# Patient Record
Sex: Male | Born: 1945 | Race: Black or African American | Hispanic: No | Smoking: Former smoker
Health system: Southern US, Community
[De-identification: ages and names within clinical notes are randomized; demographics above are authoritative.]

## PROBLEM LIST (undated history)

## (undated) DIAGNOSIS — E785 Hyperlipidemia, unspecified: Secondary | ICD-10-CM

## (undated) DIAGNOSIS — M1990 Unspecified osteoarthritis, unspecified site: Secondary | ICD-10-CM

## (undated) DIAGNOSIS — I10 Essential (primary) hypertension: Secondary | ICD-10-CM

## (undated) DIAGNOSIS — R51 Headache: Secondary | ICD-10-CM

## (undated) HISTORY — DX: Essential (primary) hypertension: I10

## (undated) HISTORY — DX: Hyperlipidemia, unspecified: E78.5

---

## 1972-07-18 HISTORY — PX: FRACTURE SURGERY: SHX138

## 1999-07-01 ENCOUNTER — Emergency Department (HOSPITAL_COMMUNITY): Admission: EM | Admit: 1999-07-01 | Discharge: 1999-07-01 | Payer: Self-pay | Admitting: Emergency Medicine

## 1999-07-01 ENCOUNTER — Encounter: Payer: Self-pay | Admitting: Emergency Medicine

## 2003-09-16 ENCOUNTER — Emergency Department (HOSPITAL_COMMUNITY): Admission: EM | Admit: 2003-09-16 | Discharge: 2003-09-16 | Payer: Self-pay

## 2003-11-10 ENCOUNTER — Ambulatory Visit (HOSPITAL_BASED_OUTPATIENT_CLINIC_OR_DEPARTMENT_OTHER): Admission: RE | Admit: 2003-11-10 | Discharge: 2003-11-10 | Payer: Self-pay | Admitting: General Surgery

## 2003-11-10 ENCOUNTER — Ambulatory Visit (HOSPITAL_COMMUNITY): Admission: RE | Admit: 2003-11-10 | Discharge: 2003-11-10 | Payer: Self-pay | Admitting: General Surgery

## 2003-11-10 ENCOUNTER — Encounter (INDEPENDENT_AMBULATORY_CARE_PROVIDER_SITE_OTHER): Payer: Self-pay | Admitting: *Deleted

## 2007-03-16 ENCOUNTER — Emergency Department (HOSPITAL_COMMUNITY): Admission: EM | Admit: 2007-03-16 | Discharge: 2007-03-17 | Payer: Self-pay | Admitting: Emergency Medicine

## 2007-07-19 HISTORY — PX: JOINT REPLACEMENT: SHX530

## 2008-02-26 ENCOUNTER — Ambulatory Visit: Payer: Self-pay

## 2008-04-16 ENCOUNTER — Inpatient Hospital Stay (HOSPITAL_COMMUNITY): Admission: RE | Admit: 2008-04-16 | Discharge: 2008-04-19 | Payer: Self-pay | Admitting: Orthopedic Surgery

## 2008-09-18 ENCOUNTER — Encounter (INDEPENDENT_AMBULATORY_CARE_PROVIDER_SITE_OTHER): Payer: Self-pay | Admitting: *Deleted

## 2008-09-18 ENCOUNTER — Observation Stay (HOSPITAL_COMMUNITY): Admission: EM | Admit: 2008-09-18 | Discharge: 2008-09-18 | Payer: Self-pay | Admitting: *Deleted

## 2008-09-19 ENCOUNTER — Inpatient Hospital Stay (HOSPITAL_COMMUNITY): Admission: EM | Admit: 2008-09-19 | Discharge: 2008-09-21 | Payer: Self-pay | Admitting: Emergency Medicine

## 2009-07-18 HISTORY — PX: JOINT REPLACEMENT: SHX530

## 2009-09-23 IMAGING — CR DG CHEST 2V
2 series · 2 of 2 positions shown · non-contrast
Comparison: None available during PACS downtime.

Addendum Begins

With comparison to 09/16/2003, no change to original report.
Addendum Ends
CLINICAL DATA: Arthritis.  Preoperative for total knee
arthroplasty.
CHEST - 2 VIEW

[view not recorded (1 of 2)]
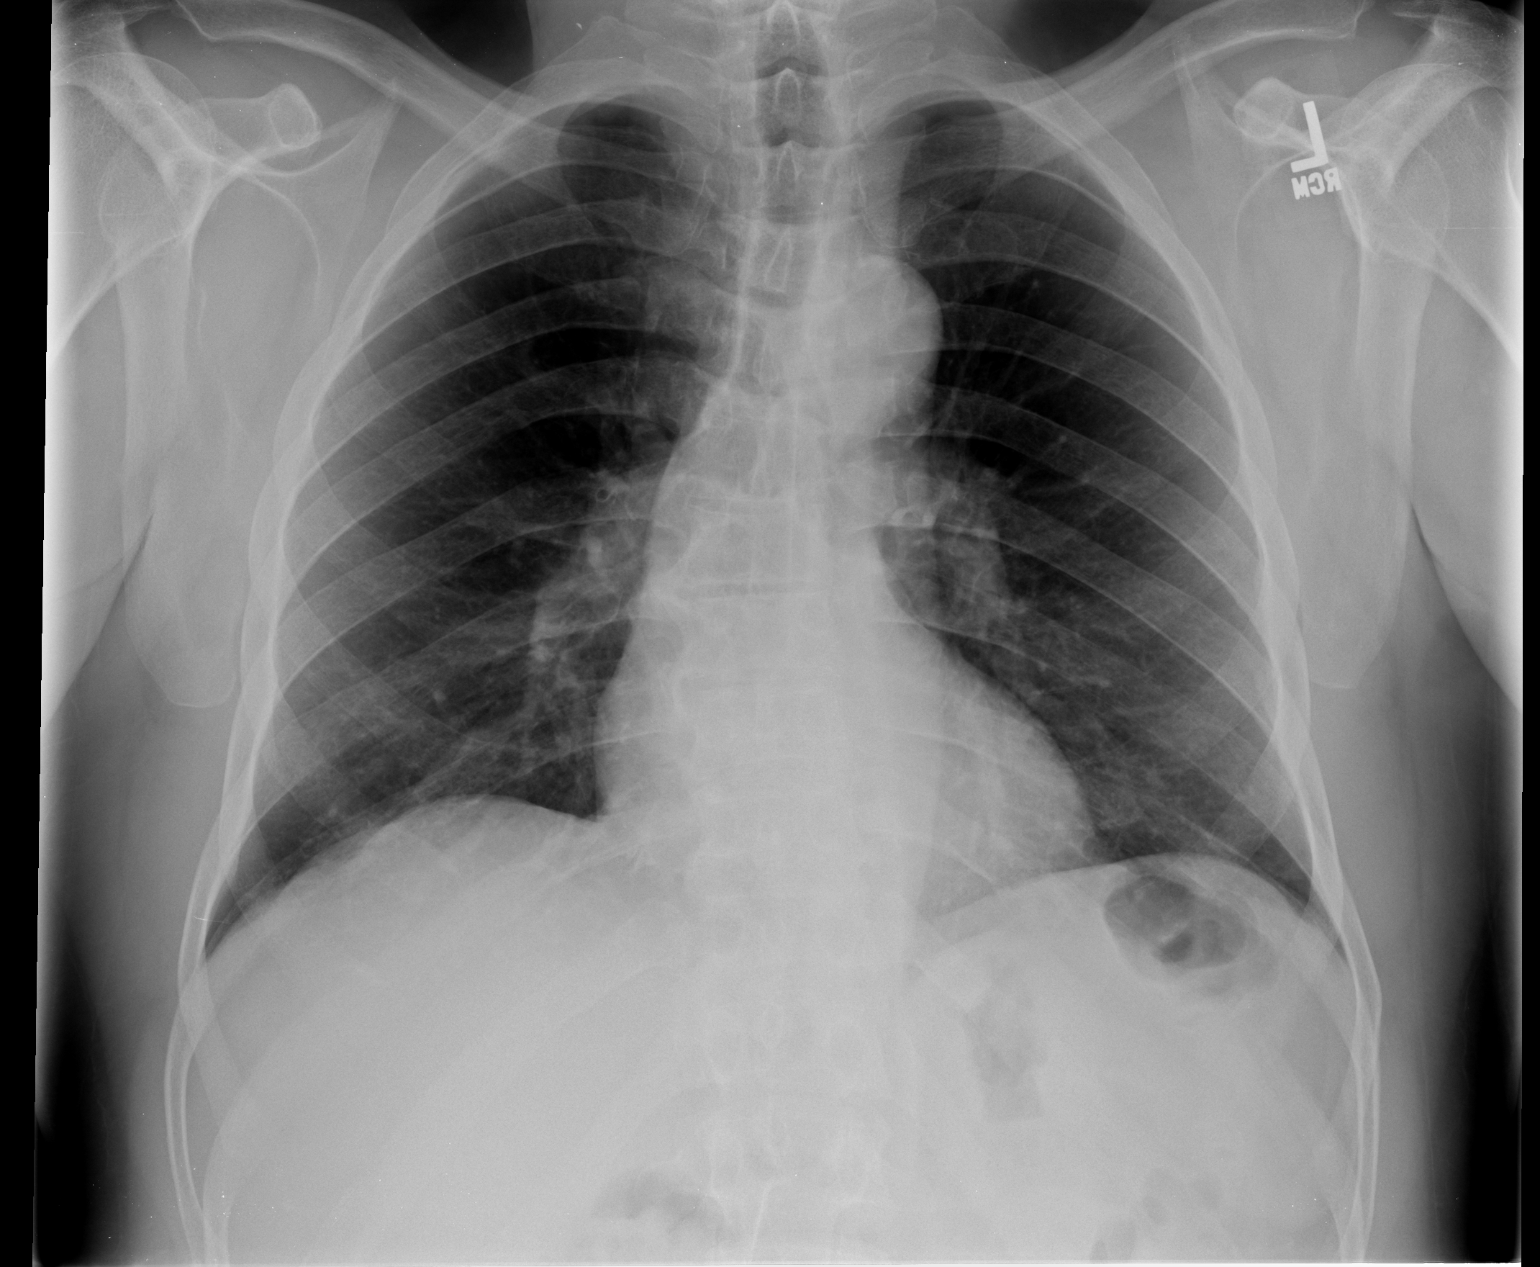

[view not recorded (2 of 2)]
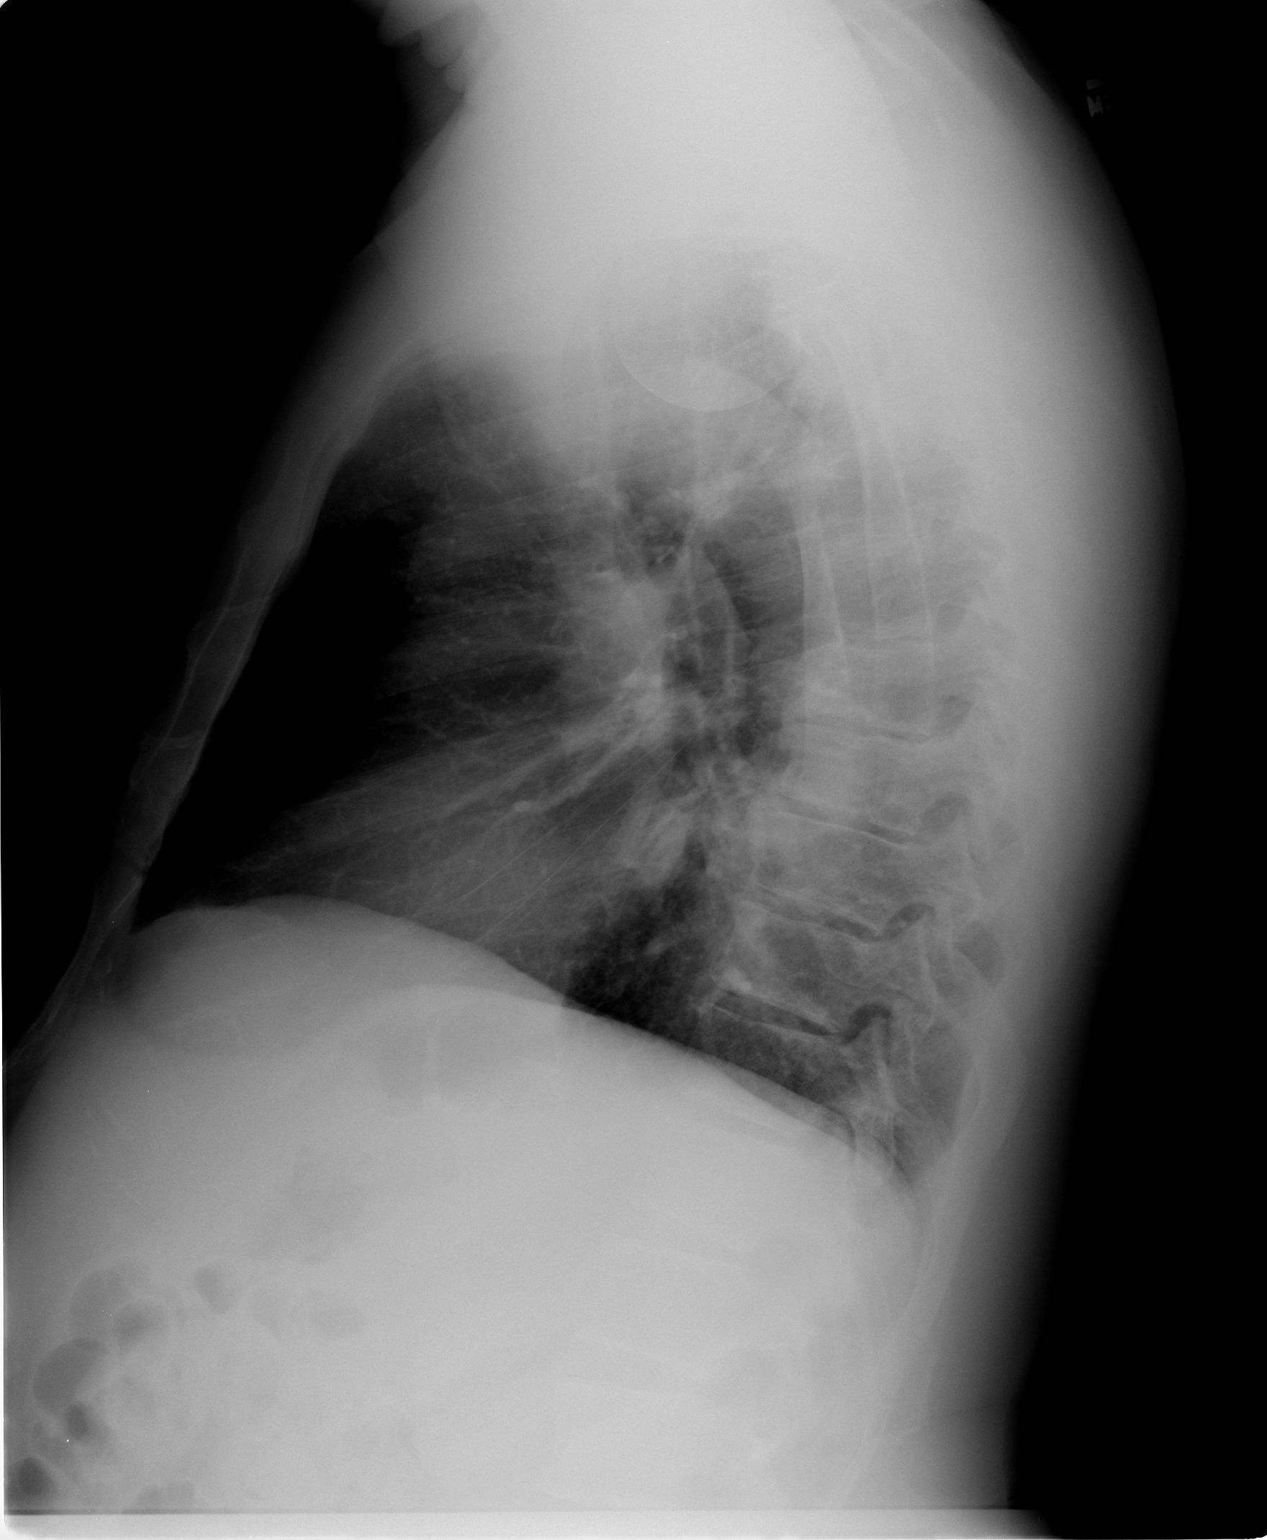

[2 of 2 positions shown; findings below may reference images not displayed]

FINDINGS: Minimal airway thickening suggests bronchitis or reactive
airways disease.  No discrete airspace opacity is identified.  No
acute thoracic findings are noted.

Cardiac and mediastinal structures appear unremarkable.
IMPRESSION: 1.  Mild airway thickening suggests bronchitis or reactive airways
disease.
2. This report was generated during a PACS downtime while prior
exams were not available. Once priors are available, the exam will
be reviewed. If there is significant new information on the basis
of prior exams, an addendum will be issued.

## 2010-07-14 ENCOUNTER — Inpatient Hospital Stay (HOSPITAL_COMMUNITY)
Admission: RE | Admit: 2010-07-14 | Discharge: 2010-07-17 | Payer: Self-pay | Source: Home / Self Care | Attending: Orthopedic Surgery | Admitting: Orthopedic Surgery

## 2010-09-06 NOTE — Discharge Summary (Signed)
[name], [name]               ACCOUNT NO.:  [id number]  MEDICAL RECORD NO.:  [id number]          PATIENT TYPE:  INP  LOCATION:  5005                         FACILITY:  [facility]  PHYSICIAN:  Loreta Ave, M.D. DATE OF BIRTH:  1945-07-24  DATE OF ADMISSION:  07/14/2010 DATE OF DISCHARGE:  07/17/2010                              DISCHARGE SUMMARY   FINAL DIAGNOSES: 1. 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 2. Hypertension.  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  He had progressively worsening pain with failed no response with conservative treatment. Significant decrease in his daily activities due to the ongoing complaint.  HOSPITAL COURSE:  On July 14, 2010, the patient was taken to the [facility] OR and a left total knee replacement procedure was performed. Surgeon was Mckinley Jewel, MD.  Assistant was Zonia Kief PA-C. Anesthesia was general with femoral nerve block.  No specimens or cultures.  Blood loss minimal.  There were no surgical or anesthesia complications, and the patient was transferred to recovery room in stable condition.  On July 15, 2010, the was patient doing well with good pain control.  Hemoglobin 11.2, INR 1.22.  Dressing clean, dry, and intact.  Calf nontender and neurovascularly intact.  Skin is warm and dry.  Discontinued PCA and Foley.  PT/OT consults.  Pharmacy protocol Coumadin and Lovenox started for DVT prophylaxis.  On July 16, 2010, the patient doing well with good pain control.  Hemoglobin 10.9, INR 1.46.  Electrolytes stable.  Knee wound looks good and staples were intact.  No drainage or signs of infection.  Calf nontender, neurovascularly intact.  Hemovac drain removed.  Continue PT.  Saline locked IV.  On July 17, 2010, the  patient was doing well. Hemoglobin 10.0, hematocrit 30.0.  INR 1.94.  The wound looks good and staples intact.  No drainage or signs of infection.  Calf nontender. The patient has progressed well with therapy and states he is ready to go home.  MEDICATIONS: 1. Percocet 10/325 one to two tablets p.o. q.6 h. p.r.n. for pain. 2. Phenergan 12.5 mg 1 tablet p.o. q.6 h. p.r.n. for nausea. 3. Lovenox 30 mg one subcu injection q.12 h. and stop when Coumadin is     therapeutic. 4. Coumadin pharmacy protocol.  Home health agency to dose and     maintain INR 2-3 x4 weeks postop for DVT prophylaxis. 5. Robaxin 500 mg 1 tablet p.o. q.6 h. p.r.n. for spasms. 6. Cipro 250 mg 1 tablet p.o. b.i.d. x1 week.  DISPOSITION:  Discharge home.  CONDITION:  Good and stable.  INSTRUCTIONS:  The patient will work with home health PT and OT to improve ambulation and knee range of motion and strengthening.  Weight bear as tolerated.  Daily dressing changes with 4x4 gauze and tape. Coumadin x4 weeks postop for DVT prophylaxis.  Knee staples to be removed 2 weeks postop and this could be done in our office.  Followup is scheduled.     Genene Churn. Denton Meek.   ______________________________ Loreta Ave, M.D.    JMO/MEDQ  D:  08/18/2010  T:  08/19/2010  Job:  [id number]  Electronically Signed by Zonia Kief P.A. on 09/03/2010 04:02:44 PM Electronically Signed by Mckinley Jewel M.D. on 09/06/2010 02:58:07 PM

## 2010-09-27 LAB — CBC
HCT: 33.7 % — ABNORMAL LOW (ref 39.0–52.0)
Hemoglobin: 14.2 g/dL (ref 13.0–17.0)
MCH: 29.2 pg (ref 26.0–34.0)
MCH: 29.3 pg (ref 26.0–34.0)
MCHC: 33.1 g/dL (ref 30.0–36.0)
MCHC: 33.2 g/dL (ref 30.0–36.0)
MCHC: 33.3 g/dL (ref 30.0–36.0)
MCHC: 34.4 g/dL (ref 30.0–36.0)
MCV: 87.7 fL (ref 78.0–100.0)
MCV: 88.7 fL (ref 78.0–100.0)
Platelets: 167 10*3/uL (ref 150–400)
Platelets: 187 10*3/uL (ref 150–400)
RBC: 3.42 MIL/uL — ABNORMAL LOW (ref 4.22–5.81)
RDW: 13 % (ref 11.5–15.5)
RDW: 13.1 % (ref 11.5–15.5)
RDW: 13.1 % (ref 11.5–15.5)
WBC: 14 10*3/uL — ABNORMAL HIGH (ref 4.0–10.5)

## 2010-09-27 LAB — SURGICAL PCR SCREEN
MRSA, PCR: NEGATIVE
Staphylococcus aureus: NEGATIVE

## 2010-09-27 LAB — URINALYSIS, ROUTINE W REFLEX MICROSCOPIC
Nitrite: NEGATIVE
Nitrite: NEGATIVE
Protein, ur: NEGATIVE mg/dL
Specific Gravity, Urine: 1.016 (ref 1.005–1.030)
Specific Gravity, Urine: 1.021 (ref 1.005–1.030)
Urobilinogen, UA: 0.2 mg/dL (ref 0.0–1.0)
Urobilinogen, UA: 0.2 mg/dL (ref 0.0–1.0)

## 2010-09-27 LAB — PROTIME-INR
INR: 1.22 (ref 0.00–1.49)
Prothrombin Time: 15.6 seconds — ABNORMAL HIGH (ref 11.6–15.2)
Prothrombin Time: 22.3 seconds — ABNORMAL HIGH (ref 11.6–15.2)

## 2010-09-27 LAB — URINE CULTURE
Colony Count: NO GROWTH
Culture: NO GROWTH

## 2010-09-27 LAB — BASIC METABOLIC PANEL
BUN: 14 mg/dL (ref 6–23)
BUN: 15 mg/dL (ref 6–23)
CO2: 25 mEq/L (ref 19–32)
CO2: 26 mEq/L (ref 19–32)
Calcium: 8.1 mg/dL — ABNORMAL LOW (ref 8.4–10.5)
Calcium: 8.3 mg/dL — ABNORMAL LOW (ref 8.4–10.5)
Chloride: 106 mEq/L (ref 96–112)
Chloride: 107 mEq/L (ref 96–112)
Creatinine, Ser: 1.14 mg/dL (ref 0.4–1.5)
Creatinine, Ser: 1.32 mg/dL (ref 0.4–1.5)
GFR calc non Af Amer: >60 mL/min (ref >60)
Glucose, Bld: 109 mg/dL — ABNORMAL HIGH (ref 70–99)
Glucose, Bld: 118 mg/dL — ABNORMAL HIGH (ref 70–99)
Glucose, Bld: 95 mg/dL (ref 70–99)

## 2010-09-27 LAB — COMPREHENSIVE METABOLIC PANEL
ALT: 28 U/L (ref 0–53)
AST: 29 U/L (ref 0–37)
Albumin: 3.9 g/dL (ref 3.5–5.2)
CO2: 24 mEq/L (ref 19–32)
Calcium: 9.1 mg/dL (ref 8.4–10.5)
GFR calc Af Amer: >60 mL/min (ref >60)
Sodium: 141 mEq/L (ref 135–145)
Total Protein: 6.6 g/dL (ref 6.0–8.3)

## 2010-09-27 LAB — URINE MICROSCOPIC-ADD ON

## 2010-09-27 LAB — TYPE AND SCREEN
ABO/RH(D): O POS
Antibody Screen: NEGATIVE

## 2010-10-28 LAB — DIFFERENTIAL
Basophils Absolute: 0 10*3/uL (ref 0.0–0.1)
Basophils Absolute: 0 10*3/uL (ref 0.0–0.1)
Basophils Relative: 0 % (ref 0–1)
Eosinophils Absolute: 0 10*3/uL (ref 0.0–0.7)
Lymphocytes Relative: 2 % — ABNORMAL LOW (ref 12–46)
Monocytes Absolute: 0.3 10*3/uL (ref 0.1–1.0)
Monocytes Relative: 3 % (ref 3–12)
Neutro Abs: 10.4 10*3/uL — ABNORMAL HIGH (ref 1.7–7.7)
Neutro Abs: 16.3 10*3/uL — ABNORMAL HIGH (ref 1.7–7.7)
Neutrophils Relative %: 86 % — ABNORMAL HIGH (ref 43–77)
Neutrophils Relative %: 89 % — ABNORMAL HIGH (ref 43–77)

## 2010-10-28 LAB — COMPREHENSIVE METABOLIC PANEL
Albumin: 3.1 g/dL — ABNORMAL LOW (ref 3.5–5.2)
BUN: 25 mg/dL — ABNORMAL HIGH (ref 6–23)
Chloride: 108 mEq/L (ref 96–112)
Creatinine, Ser: 0.97 mg/dL (ref 0.4–1.5)
GFR calc non Af Amer: >60 mL/min (ref >60)
Total Bilirubin: 0.6 mg/dL (ref 0.3–1.2)

## 2010-10-28 LAB — LIPID PANEL
Cholesterol: 157 mg/dL (ref 0–200)
HDL: 37 mg/dL — ABNORMAL LOW (ref >39)

## 2010-10-28 LAB — URINALYSIS, ROUTINE W REFLEX MICROSCOPIC
Bilirubin Urine: NEGATIVE
Bilirubin Urine: NEGATIVE
Bilirubin Urine: NEGATIVE
Ketones, ur: NEGATIVE mg/dL
Ketones, ur: NEGATIVE mg/dL
Nitrite: NEGATIVE
Protein, ur: NEGATIVE mg/dL
Protein, ur: NEGATIVE mg/dL
Specific Gravity, Urine: 1.01 (ref 1.005–1.030)
Specific Gravity, Urine: 1.022 (ref 1.005–1.030)
Urobilinogen, UA: 1 mg/dL (ref 0.0–1.0)
Urobilinogen, UA: 1 mg/dL (ref 0.0–1.0)
pH: 5.5 (ref 5.0–8.0)

## 2010-10-28 LAB — URINE CULTURE
Colony Count: NO GROWTH
Culture: NO GROWTH

## 2010-10-28 LAB — URINE MICROSCOPIC-ADD ON

## 2010-10-28 LAB — CK TOTAL AND CKMB (NOT AT ARMC)
CK, MB: 0.8 ng/mL (ref 0.3–4.0)
Relative Index: 0.5 (ref 0.0–2.5)
Total CK: 173 U/L (ref 7–232)

## 2010-10-28 LAB — BASIC METABOLIC PANEL
BUN: 17 mg/dL (ref 6–23)
Chloride: 109 mEq/L (ref 96–112)
Chloride: 110 mEq/L (ref 96–112)
Creatinine, Ser: 1.08 mg/dL (ref 0.4–1.5)
GFR calc Af Amer: >60 mL/min (ref >60)
Glucose, Bld: 86 mg/dL (ref 70–99)
Potassium: 3.8 mEq/L (ref 3.5–5.1)
Sodium: 140 mEq/L (ref 135–145)

## 2010-10-28 LAB — CULTURE, BLOOD (ROUTINE X 2)

## 2010-10-28 LAB — CBC
HCT: 36.1 % — ABNORMAL LOW (ref 39.0–52.0)
HCT: 36.9 % — ABNORMAL LOW (ref 39.0–52.0)
HCT: 37.8 % — ABNORMAL LOW (ref 39.0–52.0)
Hemoglobin: 13.1 g/dL (ref 13.0–17.0)
MCHC: 34.3 g/dL (ref 30.0–36.0)
MCV: 87.5 fL (ref 78.0–100.0)
MCV: 88.7 fL (ref 78.0–100.0)
MCV: 88.7 fL (ref 78.0–100.0)
MCV: 89.1 fL (ref 78.0–100.0)
Platelets: 191 10*3/uL (ref 150–400)
Platelets: 198 10*3/uL (ref 150–400)
RBC: 4.16 MIL/uL — ABNORMAL LOW (ref 4.22–5.81)
RBC: 4.37 MIL/uL (ref 4.22–5.81)
RDW: 14 % (ref 11.5–15.5)
RDW: 14.6 % (ref 11.5–15.5)
WBC: 15 10*3/uL — ABNORMAL HIGH (ref 4.0–10.5)
WBC: 18.3 10*3/uL — ABNORMAL HIGH (ref 4.0–10.5)
WBC: 7.9 10*3/uL (ref 4.0–10.5)

## 2010-10-28 LAB — POCT CARDIAC MARKERS
CKMB, poc: <1 ng/mL — ABNORMAL LOW (ref 1.0–8.0)
Myoglobin, poc: 171 ng/mL (ref 12–200)
Troponin i, poc: <0.05 ng/mL (ref 0.00–0.09)

## 2010-10-28 LAB — POCT I-STAT, CHEM 8
Creatinine, Ser: 1.2 mg/dL (ref 0.4–1.5)
HCT: 40 % (ref 39.0–52.0)
Hemoglobin: 13.6 g/dL (ref 13.0–17.0)
Sodium: 138 mEq/L (ref 135–145)
TCO2: 21 mmol/L (ref 0–100)

## 2010-11-30 NOTE — Discharge Summary (Signed)
[name], [name]               ACCOUNT NO.:  [id number]   MEDICAL RECORD NO.:  [id number]          PATIENT TYPE:  INP   LOCATION:  5036                         FACILITY:  [facility]   PHYSICIAN:  Peggye Pitt, M.D. DATE OF BIRTH:  1946/07/07   DATE OF ADMISSION:  09/18/2008  DATE OF DISCHARGE:  09/21/2008                               DISCHARGE SUMMARY   DISCHARGE DIAGNOSES:  1. Urosepsis secondary to Klebsiella pneumoniae.  2. Klebsiella pneumoniae bacteremia.  3. Hyperlipidemia.  4. Hypertension.  5. History of genital herpes, on daily suppressive therapy.   DISCHARGE MEDICATIONS:  1. Cipro 250 mg twice daily for a total of 14 days.  2. Acyclovir 400 mg twice daily.  3. Benazepril 20 mg daily.  4. Norvasc 10 mg daily.  5. Zocor 40 mg at bedtime.   DISPOSITION AND FOLLOWUP:  [name] is discharged home in stable  condition.  He is instructed taking his Cipro for a total of 14 days.  He is also instructed to call his primary care physician's office in the  morning, Dr. Leonette Most, to set up an appointment for him to be seen next  week.  Please note that on day of discharge, he had a slight low grade  temperature of 100.8, but given his clinical status, I believe that it  is safe to discharge him home.  He is instructed to call his primary  care physician if he continues to have elevated temperatures throughout  the week.   CONSULTATION THIS HOSPITALIZATION:  none.   IMAGES AND PROCEDURES:  Chest x-ray on September 17, 2008 that showed no  acute disease.   HISTORY AND PHYSICAL EXAM:  For full details, please refer to history  and physical dictated by Dr. Abram Sander on September 19, 2008, but in brief, Mr.  Jefferson is a very pleasant 65 year old African American man with a history  of genital herpes, for which he takes daily suppressive therapy with  acyclovir for many years without any flare-ups.  Also has a history of  hypertension and hyperlipidemia.  He started having dysuria about 5  days  prior to admission, came in to the emergency department because of  weakness and fever, was given a dose of Rocephin  and was discharged  home on Cipro.  However, his blood cultures grew Gram-negative rods and  he was called back to return to the hospital.  This is the reason the  Hospitalist Service was called to admit him for further evaluation and  management.   HOSPITAL COURSE:  1. Urosepsis with Gram-negative rod bacteremia.  Subsequent blood      cultures have grown Klebsiella pneumoniae that is pansensitive.      His urine culture has had no growth.  He has received a total of 3      days of IV Rocephin.  He will be discharged home on Cipro to      complete 14 days of antibiotics for his UTI and bacteremia.  Please      note that he did have a low grade temperature of 100.8 on day of  discharge; however, his clinical status has been exceptional and I      believe it is safe to discharge him home.  I have asked him to call      his primary care physician over the week if he continues to have      temperatures.  2. Hyperlipidemia.  He has had an excellent fasting lipid panel while      in the hospital with a total cholesterol of 157, triglycerides of      116, an HDL of 37 and an LDL of 97.  He was kept on his Zocor 40      mg.  3. Hypertension.  He has had exceptional blood pressure control while      in the hospital.  Blood pressure on the day of discharge was      135/85.  He has been continued on his benazepril.  4. Herpes.  He has been continued on his daily suppressive therapy      with acyclovir.   Rest of chronic medical problems have not been an issue this  hospitalization.   VITAL SIGNS ON DAY OF DISCHARGE:  Blood pressure 135/85, heart rate 84,  respirations 20, O2 sats 97% on room air with a Tmax, that is the  maximum temperature, of 100.8.      Peggye Pitt, M.D.  Electronically Signed     EH/MEDQ  D:  09/21/2008  T:  09/21/2008  Job:   [id number]   cc:   Dr. Leonette Most

## 2010-11-30 NOTE — Op Note (Signed)
NAMEJONAS, GOH               ACCOUNT NO.:  [id number]   MEDICAL RECORD NO.:  [id number]          PATIENT TYPE:  INP   LOCATION:  5014                         FACILITY:  [facility]   PHYSICIAN:  Loreta Ave, M.D. DATE OF BIRTH:  04-Jul-1946   DATE OF PROCEDURE:  04/16/2008  DATE OF DISCHARGE:                               OPERATIVE REPORT   PREOPERATIVE DIAGNOSIS:  End-stage degenerative arthritis, varus  alignment, and flexion contracture of right knee.   POSTOPERATIVE DIAGNOSIS:  End-stage degenerative arthritis, varus  alignment, and flexion contracture of right knee.   PROCEDURE:  1. Right total knee replacement Stryker triathlon prosthesis.  2. Modified minimally invasive system.  3. Soft tissue balancing.  4. Cemented pegged posterior stabilized #6 femoral component.  5. Cemented #6 tibial component 9-mm polyethylene insert.  6. Resurfacing 35-mm cemented pegged medial offset patellar component.   SURGEON:  Loreta Ave, MD   ASSISTANT:  Genene Churn. Barry Dienes, [place], whose presence throughout the entire  case was necessary for timely completion of procedure.   ANESTHESIA:  General.   BLOOD LOSS:  Minimal.   TOURNIQUET TIME:  1 hour and 15 minutes.   SPECIMENS:  None.   COMPLICATIONS:  None.   DRESSING:  Soft compressive with knee immobilizer.   DRAIN:  Hemovac x1.   PROCEDURE:  The patient was brought to the operating room and placed on  the operating table in the supine position.  After adequate anesthesia  had been obtained, the right knee was examined.  A little bit less than  5 degrees flexion contracture, 5 degrees varus correctable to neutral,  flexion better than 100 degrees.  Stable ligaments.  Tourniquet was  applied, prepped and draped in the usual sterile fashion.  Exsanguinated  with elevation Esmarch, tourniquet was placed to 350 mmHg.  Straight  incision above the patella down to the tibial tubercle.  Medial  arthrotomy vastus splitting preserving  the quad tendon.  Knee was  exposed.  Grade 4 change throughout.  Remnants of menisci, cruciate  ligaments, loose body, and spurs were removed.  Distal femur was  exposed.  Intramedullary guide was placed.  A 10-mm cut, 75 degrees of  valgus.  Using epicondylar axis sized, cut, and fitted for a #6 pegged  femoral component.  Extramedullary guide on the tibia.  Cuff below the  medial defect on the tibia with a 3-degree posterior slope cut  perpendicular to the axis.  Size #6 component.  After medial capsule  release, soft tissue balancing, and clearing out all spurs and debris,  trials were put in place on the femur and tibia.  With a #6 above and  below with a 9-mm insert, nicely balanced knee.  Full extension, full  flexion.  Patella was exposed, posterior 10 mm removed, sized, drilled,  and fitted with a 35-mm component.  Excellent tracking with trial.  Tibia was marked for rotation with trials.  All trials were removed.  Tibia was punched for the insert.  Copious irrigation with a pulse  irrigating device.  Cement was prepared, placed on all components which  were  firmly seated with polyethylene, attached to tibia and the knee  reduced.  Once cement hardened, knee was reexamined.  Full extension,  full flexion, good alignment, good stability, good patellofemoral  tracking.  Hemovac was placed and brought out through a separate stab  wound.  Arthrotomy closed with #1 Vicryl, skin and subcutaneous tissue  with Vicryl and staples.  Knee was injected with Marcaine, Hemovac  clamp.  Sterile compressive dressing was applied.  Tourniquet was  deflated and removed.  Knee immobilizer was applied.  Anesthesia was  reversed.  Brought to the recovery room.  Tolerated the surgery well.  No complications.      Loreta Ave, M.D.  Electronically Signed     DFM/MEDQ  D:  04/16/2008  T:  04/17/2008  Job:  [id number]

## 2010-11-30 NOTE — H&P (Signed)
NAMECARY, [name] NO.:  [id number]   MEDICAL RECORD NO.:  [id number]          PATIENT TYPE:  INP   LOCATION:  5036                         FACILITY:  [facility]   PHYSICIAN:  Ruthy Dick, MD    DATE OF BIRTH:  1945/11/25   DATE OF ADMISSION:  09/18/2008  DATE OF DISCHARGE:                              HISTORY & PHYSICAL   The patient seen and examined in the emergency room.   CHIEF COMPLAINT:  Abnormal lab report, gram-negative rods in blood and  urosepsis.   HISTORY OF PRESENT ILLNESS:  [name] is a 65 year old African American  male with a past medical history significant for genital herpes for  which he takes suppression therapy with acyclovir for many years without  any flare up.  He is also a patient with hypertension and dyslipidemia  who started having dysuria about 5-6 days ago at home.  He thought the  dysuria will get better but for some reason it did not get any better  and the patient eventually went to see his urologist who treated him  with IM antibiotics and sent him home.  He went home with this but this  did not resolve with his medications.  So, on Wednesday on March 3, he  came back to this hospital because of generalized weakness, fever, and  chills.  He was diagnosed as having another urinary tract infection  again here and was treated with Rocephin and kept overnight.  The  patient was eventually discharged home today after he has been given  more IV antibiotics and discharged on oral Cipro.  Again, later on March  4 that he was discharged, they called him back at home and said he  should be in the hospital because he has a positive blood culture.  At  that time, I saw this patient early this morning on March 5.  He says he  has no more complaints.  His fever seems to have been gone according to  him and no dysuria, no frequency, no abdominal pain, no nausea, no  vomiting.  He denies chest pain.  Denies palpitation.  Denies headache.  Denies abdominal pain, diarrhea.  Denies claudication.  Denies syncope.  Denies skin rash.   PAST MEDICAL HISTORY:  Hypertension, dyslipidemia, and genital herpes.   MEDICATIONS:  1. He takes acyclovir 400 mg p.o. b.i.d.  2. He takes Lotrel 10/20 mg p.o. daily.  3. He takes Zocor 40 mg p.o. daily.   ALLERGIES:  According to him, PENICILLIN.   SOCIAL HISTORY:  States his daughter lives with him.  Does not drink,  smoke, or do illicit drug.   FAMILY HISTORY:  According to him, there is hypertension and diabetes in  the family, otherwise, noncontributory.   REVIEW OF SYSTEMS:  A 12-point review of systems was done and was  negative except noted in the history of presenting illness.   PHYSICAL EXAMINATION:  GENERAL:  The patient seen and examined in the  emergency room.  He is alert and oriented x3 in no acute cardiopulmonary  distress, awake.  HEENT:  Normocephalic atraumatic.  Pupils equal, round, and reactive to  light.  Extraocular muscles intact.  Nares patent.  NECK:  Supple.  No JVD.  No lymphadenopathy.  No thyromegaly.  CHEST:  Clear to auscultation bilaterally.  No rhonchi.  No rales.  No  wheezing.  ABDOMEN:  Soft, nontender.  No hepatosplenomegaly.  VITAL SIGNS:  Temperature 97.8 now, blood pressure 171/84, pulse 108,  respiration 20, and saturating at 99% on room air.   LABS AND INVESTIGATIONS:  Early on today, the patient's urinalysis had  shown a large leukocyte esterase with 21-50 wbc's and nitrite was  negative.  Bacteria was said to be 0.  There was moderate blood, but  only 7-10 rbc's and this was drawn on the 3rd.  Blood culture and  sensitivity drawn also on the 3rd, two bottles shows gram-negative rods.  Identification and sensitivity is pending.  On the 3rd, the patient did  have a slightly low potassium of 3.3.  Cardiac enzymes drawn on the 4th  were negative and today, the patient had a white blood cell count of  18.3 with 89% segment.  Hemoglobin 12,  hematocrit 37.  Chest x-ray had  been done earlier on the 3rd and showed no acute disease.   ASSESSMENT:  1. Bacteremia with gram-negative rods.  2. Urosepsis.  3. Hypertension.  4. Genital herpes.  5. Dyslipidemia.   PLAN OF CARE:  The patient will be admitted to med-surg floor given  slight IV hydration and IV antibiotics will be provided.  I would go  with Rocephin at this time even though the patient said to be allergic  to penicillin.  Earlier on, he has been Rocephin in the emergency room  without any side effects.  The patient probably will need a couple of  days of IV antibiotics and then may be transitioned back to oral as per  the primary physician on the team.  This patient's primary care  physician is in [facility] and he sees urologist, Dr. Lindley Magnus.      Ruthy Dick, MD  Electronically Signed     GU/MEDQ  D:  09/19/2008  T:  09/19/2008  Job:  [id number]

## 2010-12-03 NOTE — Op Note (Signed)
[name], [name]                           ACCOUNT NO.:  [id number]   MEDICAL RECORD NO.:  [id number]                   PATIENT TYPE:  AMB   LOCATION:  DSC                                  FACILITY:  [facility]   PHYSICIAN:  Leonie Man, M.D.                DATE OF BIRTH:  [birth year]   DATE OF PROCEDURE:  11/10/2003  DATE OF DISCHARGE:                                 OPERATIVE REPORT   PREOPERATIVE DIAGNOSIS:  Lipoma, left shoulder.   POSTOPERATIVE DIAGNOSIS:  Lipoma, left shoulder.   PROCEDURE:  Excision, lipoma, right shoulder.   SURGEON:  Leonie Man, M.D.   ASSISTANT:  Nurse.   ANESTHESIA:  General.   NOTE:  The patient is a 65 year old man presenting with a lipoma which  measures approximately 6 cm in greatest diameter, which has been causing  some severe pressure symptoms and is in the deltoid muscle.  He comes to the  operating room now for excision of this mass after the risks and potential  benefits of surgery have been discussed, all questions answered, and consent  obtained.   PROCEDURE:  Following the induction of satisfactory general anesthesia, the  patient is positioned supinely.  The right shoulder is prepped and draped to  be included in the sterile operative field.  A curvilinear incision is made  over the mass, deepened this through the skin and subcutaneous tissue down  to the capsule, which was really quite superficial and within the  subcutaneous tissues.  There were multiple attachments to the overlying  skin.  These were dissected free from around the lipoma, carrying the  dissection down to the deltoid muscle, and the attachments to the deltoid  muscle were dissected free.  There was no penetration of the mass into the  deltoid itself.  The mass was removed in its entirety and forwarded for  pathologic evaluation.  Hemostasis was then obtained with electrocautery.  Sponge, instrument, and sharp counts verified.  Subcutaneous tissues were  closed  with interrupted 3-0 Vicryl sutures and the skin was closed with a  running 4-0 Monocryl suture, then reinforced with Steri-Strips, a sterile  dressing applied.  Anesthetic reversed.  The patient removed from the  operating room to the recovery room in stable condition.  He tolerated the  procedure well.                                               Leonie Man, M.D.    PB/MEDQ  D:  11/10/2003  T:  11/10/2003  Job:  [id number]

## 2010-12-03 NOTE — Discharge Summary (Signed)
[name], [name]               ACCOUNT NO.:  [id number]   MEDICAL RECORD NO.:  [id number]          PATIENT TYPE:  INP   LOCATION:  5014                         FACILITY:  [facility]   PHYSICIAN:  Loreta Ave, M.D. DATE OF BIRTH:  [birth year]   DATE OF ADMISSION:  04/16/2008  DATE OF DISCHARGE:  04/19/2008                               DISCHARGE SUMMARY   FINAL DIAGNOSES:  1. Status post right total knee replacement for end-stage degenerative      joint disease.  2. Hypertension.  3. History of tobacco use.   HISTORY OF PRESENT ILLNESS:  A 65 year old black male with a history of  end-stage DJD, right knee and chronic pain, who presented to our office  for preoperative evaluation for total knee replacement.  He had a  progressively worsening pain with failed response with conservative  treatment.  Significant decrease in his daily activities due to the  ongoing complaint.   HOSPITAL COURSE:  On April 16, 2008, the patient was taken to the  [facility] OR and a right total knee replacement procedure performed.   SURGEON:  Loreta Ave, MD   ASSISTANT:  Genene Churn. Barry Dienes, PA-C   ANESTHESIA:  General.   EBL:  Minimal.   TOURNIQUET TIME:  1 hour and 15 minutes.   SPECIMENS:  None.   1 Hemovac drain placed.   COMPLICATIONS:  There were no surgical or anesthesia complications.   CONDITION ON DISCHARGE:  The patient was transferred to recovery room in  stable condition.   On April 17, 2008, the patient doing well with good pain control.  Hemoglobin 12.3 and hematocrit 36.9.  INR 1.1.  Dressing clean, dry, and  intact.  Discontinue the Foley.  OR nurse had some difficulty entering  the Foley catheter preop.  The patient stated that he had some prostate  issues in the past.  PSA ordered.  Discontinued morphine and PCA.  On  April 18, 2008, the patient doing well with good pain control.  Progressing with PT.  Vitals signs stable and afebrile.  Hemoglobin 10.9  and  hematocrit 32.0.  INR 1.4.  PCA normal at 3.56.  Wound looks good  and staples intact.  No drains or signs of infection.  Hemovac drain  discontinued.  Calf nontender and neurovascularly intact.  Saline lock  IV.  On April 19, 2008, the patient doing well.  No complaints.  He  states that he is ready to go home.  Vital signs are stable and  afebrile.  Hemoglobin 10.5 and hematocrit 38.9.  INR 1.8.  Electrolytes  are stable.  Wound looks good and staples intact.  No drainage or signs  of infection.  Calf nontender and neurovascularly intact.   DISPOSITION:  Discharge home.   CONDITION:  Good and stable.   MEDICATIONS:  1. Percocet 10/325, 1-2 tablets p.o. q.4-6 h. p.r.n. for pain.  2. Robaxin 500 mg 1 tablet p.o. q.6 h. p.r.n. for spasms.  3. Coumadin pharmacy protocol.  4. Lovenox 30 mg 1 subcu injection b.i.d. x3 days then discontinue      when Coumadin  therapeutic with INR 2-3.  5. Resume previous home meds.   INSTRUCTIONS:  The patient will work with home health PT and OT to  improve ambulation, knee range of motion, and strengthening.  Daily  dressing changes with 4x4 gauze and tape.  Weightbearing as tolerated.  Followup when he is 2 weeks postop for recheck.  Return sooner if  needed.      Loreta Ave, M.D.  Electronically Signed     DFM/MEDQ  D:  06/04/2008  T:  06/05/2008  Job:  [id number]

## 2011-04-18 LAB — URINALYSIS, ROUTINE W REFLEX MICROSCOPIC
Hgb urine dipstick: NEGATIVE
Protein, ur: NEGATIVE
Specific Gravity, Urine: 1.026
Urobilinogen, UA: 0.2

## 2011-04-18 LAB — BASIC METABOLIC PANEL
BUN: 12
BUN: 15
CO2: 28
Calcium: 8.5
Calcium: 8.7
Creatinine, Ser: 1.01
Creatinine, Ser: 1.01
Creatinine, Ser: 1.16
GFR calc Af Amer: >60
GFR calc Af Amer: >60
GFR calc non Af Amer: >60
GFR calc non Af Amer: >60
GFR calc non Af Amer: >60
Glucose, Bld: 104 — ABNORMAL HIGH
Glucose, Bld: 98
Potassium: 3.7

## 2011-04-18 LAB — PROTIME-INR
INR: 1
INR: 1.1
INR: 1.8 — ABNORMAL HIGH
Prothrombin Time: 13.3
Prothrombin Time: 14.3
Prothrombin Time: 18.2 — ABNORMAL HIGH
Prothrombin Time: 21.8 — ABNORMAL HIGH

## 2011-04-18 LAB — CBC
HCT: 41.7
HCT: 32 — ABNORMAL LOW
Hemoglobin: 14
MCHC: 33.9
MCV: 91.2
MCV: 91.5
Platelets: 244
Platelets: 161
Platelets: 208
RBC: 3.39 — ABNORMAL LOW
RBC: 4.03 — ABNORMAL LOW
RDW: 13.2
RDW: 13.1
RDW: 13.2
WBC: 12.4 — ABNORMAL HIGH

## 2011-04-18 LAB — COMPREHENSIVE METABOLIC PANEL
Albumin: 3.6
BUN: 11
Chloride: 105
Creatinine, Ser: 0.95
Glucose, Bld: 104 — ABNORMAL HIGH
Total Bilirubin: 0.7
Total Protein: 6.5

## 2011-04-18 LAB — FREE PSA
PSA, Free Pct: 11 — ABNORMAL LOW (ref >25)
PSA, Free: 0.4

## 2011-04-18 LAB — ABO/RH: ABO/RH(D): O POS

## 2011-04-18 LAB — PSA: PSA: 3.56

## 2011-05-09 ENCOUNTER — Encounter (INDEPENDENT_AMBULATORY_CARE_PROVIDER_SITE_OTHER): Payer: Self-pay | Admitting: General Surgery

## 2011-05-11 ENCOUNTER — Encounter (INDEPENDENT_AMBULATORY_CARE_PROVIDER_SITE_OTHER): Payer: Self-pay | Admitting: General Surgery

## 2011-05-11 ENCOUNTER — Ambulatory Visit (INDEPENDENT_AMBULATORY_CARE_PROVIDER_SITE_OTHER): Payer: PRIVATE HEALTH INSURANCE | Admitting: General Surgery

## 2011-05-11 VITALS — BP 152/90 | HR 72 | Temp 96.8°F | Resp 16 | Ht 68.0 in | Wt 215.6 lb

## 2011-05-11 DIAGNOSIS — K644 Residual hemorrhoidal skin tags: Secondary | ICD-10-CM

## 2011-05-11 DIAGNOSIS — K648 Other hemorrhoids: Secondary | ICD-10-CM

## 2011-05-11 NOTE — Progress Notes (Signed)
Chief Complaint  Patient presents with  . Rectal Problems    Eval of hemorrhoids - self referred.    HPI [name] is a 65 y.o. male.    This is a 65 year old gentleman who is self-referred for management of hemorrhoids. His primary care physician is Dr. Loyal Jacobson in [facility].  The patient states he had a colonoscopy about 3 years ago in [place], probably at [facility]. He says nothing was found other than hemorrhoids. He complains of hemorrhoid symptoms for over a decade. His biggest problem is large external hemorrhoids which are very difficult to clean and he would like these removed. He does not have any pain. He rarely bleeds. He doesn't have any itching. Does not have  any protrusion of internal hemorrhoids. He states that he has been thinking about having  surgery for years and he would  like to do this sometime this year. HPI  Past Medical History  Diagnosis Date  . Genital herpes   . Hypertension   . Hyperlipidemia     Past Surgical History  Procedure Date  . Joint replacement 2011    left knee  . Joint replacement 2009    right knee    Family History  Problem Relation Age of Onset  . Stroke Mother   . Hypertension Sister   . Hypertension Brother     Social History History  Substance Use Topics  . Smoking status: Former Smoker    Quit date: 05/10/1972  . Smokeless tobacco: Never Used  . Alcohol Use: No    Allergies  Allergen Reactions  . Penicillins Hives    All over the body    Current Outpatient Prescriptions  Medication Sig Dispense Refill  . atorvastatin (LIPITOR) 20 MG tablet       . VIAGRA 50 MG tablet       . acyclovir (ZOVIRAX) 400 MG tablet Take 400 mg by mouth 2 (two) times daily.        Marland Kitchen amLODipine-benazepril (LOTREL) 10-20 MG per capsule Take 1 capsule by mouth daily.        . simvastatin (ZOCOR) 40 MG tablet Take 40 mg by mouth at bedtime.          Review of Systems Review of Systems  Constitutional: Negative for fever,  chills and unexpected weight change.  HENT: Negative for hearing loss, congestion, sore throat, trouble swallowing and voice change.   Eyes: Negative for visual disturbance.  Respiratory: Negative for cough and wheezing.   Cardiovascular: Negative for chest pain, palpitations and leg swelling.  Gastrointestinal: Negative for nausea, vomiting, abdominal pain, diarrhea, constipation, blood in stool, abdominal distention, anal bleeding and rectal pain.  Genitourinary: Negative for hematuria and difficulty urinating.  Musculoskeletal: Negative for arthralgias.  Skin: Negative for rash and wound.  Neurological: Negative for seizures, syncope, weakness and headaches.  Hematological: Negative for adenopathy. Does not bruise/bleed easily.  Psychiatric/Behavioral: Negative for confusion.    Blood pressure 152/90, pulse 72, temperature 96.8 F (36 C), temperature source Temporal, resp. rate 16, height 5\' 8"  (1.727 m), weight 215 lb 9.6 oz (97.796 kg).  Physical Exam Physical Exam  Constitutional: He is oriented to person, place, and time. He appears well-developed and well-nourished. No distress.  HENT:  Head: Normocephalic.  Nose: Nose normal.  Mouth/Throat: No oropharyngeal exudate.  Eyes: Conjunctivae and EOM are normal. Pupils are equal, round, and reactive to light. Right eye exhibits no discharge. Left eye exhibits no discharge. No scleral icterus.  Neck: Normal  range of motion. Neck supple. No JVD present. No tracheal deviation present. No thyromegaly present.  Cardiovascular: Normal rate, regular rhythm, normal heart sounds and intact distal pulses.   No murmur heard. Pulmonary/Chest: Effort normal and breath sounds normal. No stridor. No respiratory distress. He has no wheezes. He has no rales. He exhibits no tenderness.  Abdominal: Soft. Bowel sounds are normal. He exhibits no distension and no mass. There is no tenderness. There is no rebound and no guarding.  Genitourinary:      Musculoskeletal: Normal range of motion. He exhibits no edema and no tenderness.  Lymphadenopathy:    He has no cervical adenopathy.  Neurological: He is alert and oriented to person, place, and time. He has normal reflexes. Coordination normal.  Skin: Skin is warm and dry. No rash noted. He is not diaphoretic. No erythema. No pallor.  Psychiatric: He has a normal mood and affect. His behavior is normal. Judgment and thought content normal.    Data Reviewed none  Assessment    External hemorrhoids, left lateral and right anterior, complicated by 2 pain, occasional bleeding, and difficulty with cleansing.  Internal hemorrhoids, stage I.  Status post umbilical hernia repair.  History of urosepsis.  Hypertension  Status post bilateral total knee replacement.    Plan    We talked about different strategies for care. He would like to have these surgically removed. We will schedule for internal and external hemorrhoidectomy, 2 columns, left lateral and right anterior in the near future.  I have discussed indications and details of surgery with him. Risks and complications have been outlined, including limited to bleeding, infection, temporary pain, recurrence of hemorrhoids, rarely incontinence, and other  problems. He understands these issues well. All of his questions are answered. He agrees with this plan.       Dravyn Severs M 05/11/2011, 12:31 PM

## 2011-05-11 NOTE — Patient Instructions (Signed)
You have 2 large external hemorrhoids that are causing your symptoms. You also have some internal hemorrhoids. You will be scheduled for surgery to remove the hemorrhoids.  Hemorrhoidectomy Hemorrhoidectomy is surgery to remove hemorrhoids. Hemorrhoids are veins that have become swollen in the rectum. The rectum is the area from the bottom end of the intestines to the opening where bowel movements leave the body. Hemorrhoids can be uncomfortable. They can cause itching, bleeding and pain if a blood clot forms in them (thrombose). If hemorrhoids are small, surgery may not be needed. But if they cover a larger area, surgery is usually suggested.  LET YOUR CAREGIVER KNOW ABOUT:   Any allergies.   All medications you are taking, including:   Herbs, eyedrops, over-the-counter medications and creams.   Blood thinners (anticoagulants), aspirin or other drugs that could affect blood clotting.   Use of steroids (by mouth or as creams).   Previous problems with anesthetics, including local anesthetics.   Possibility of pregnancy, if this applies.   Any history of blood clots.   Any history of bleeding or other blood problems.   Previous surgery.   Smoking history.   Other health problems.  RISKS AND COMPLICATIONS All surgery carries some risk. However, hemorrhoid surgery usually goes smoothly. Possible complications could include:  Urinary retention.   Bleeding.   Infection.   A painful incision.   A reaction to the anesthesia (this is not common).  BEFORE THE PROCEDURE   Stop using aspirin and non-steroidal anti-inflammatory drugs (NSAIDs) for pain relief. This includes prescription drugs and over-the-counter drugs such as ibuprofen and naproxen. Also stop taking vitamin E. If possible, do this two weeks before your surgery.   If you take blood-thinners, ask your healthcare provider when you should stop taking them.   You will probably have blood and urine tests done several  days before your surgery.   Do not eat or drink for about 8 hours before the surgery.   Arrive at least an hour before the surgery, or whenever your surgeon recommends. This will give you time to check in and fill out any needed paperwork.   Hemorrhoidectomy is often an outpatient procedure. This means you will be able to go home the same day. Sometimes, though, people stay overnight in the hospital after the procedure. Ask your surgeon what to expect. Either way, make arrangements in advance for someone to drive you home.  PROCEDURE   The preparation:   You will change into a hospital gown.   You will be given an IV. A needle will be inserted in your arm. Medication can flow directly into your body through this needle.   You might be given an enema to clear your rectum.   Once in the operating room, you will probably lie on your side or be repositioned later to lying on your stomach.   You will be given anesthesia (medication) so you will not feel anything during the surgery. The surgery often is done with local anesthesia (the area near the hemorrhoids will be numb and you will be drowsy but awake). Sometimes, general anesthesia is used (you will be asleep during the procedure).   The procedure:   There are a few different procedures for hemorrhoids. Be sure to ask you surgeon about the procedure, the risks and benefits.   Be sure to ask about what you need to do to take care of the wound, if there is one.  AFTER THE PROCEDURE  You will stay in  a recovery area until the anesthesia has worn off. Your blood pressure and pulse will be checked every so often.   You may feel a lot of pain in the area of the rectum.   Take all pain medication prescribed by your surgeon. Ask before taking any over-the-counter pain medicines.   Sometimes sitting in a warm bath can help relieve your pain.   To make sure you have bowel movements without straining:   You will probably need to take stool  softeners (usually a pill) for a few days.   You should drink 8 to 10 glasses of water each day.   Your activity will be restricted for awhile. Ask your caregiver for a list of what you should and should not do while you recover.  Document Released: 05/01/2009 Document Revised: 03/16/2011 Document Reviewed: 05/01/2009 [facility] Patient Information 2012 [place], [place].

## 2011-06-01 ENCOUNTER — Other Ambulatory Visit (INDEPENDENT_AMBULATORY_CARE_PROVIDER_SITE_OTHER): Payer: Self-pay | Admitting: General Surgery

## 2011-06-02 ENCOUNTER — Encounter (HOSPITAL_COMMUNITY): Payer: Self-pay

## 2011-06-02 ENCOUNTER — Encounter (HOSPITAL_COMMUNITY)
Admission: RE | Admit: 2011-06-02 | Discharge: 2011-06-02 | Payer: PRIVATE HEALTH INSURANCE | Source: Ambulatory Visit | Attending: General Surgery | Admitting: General Surgery

## 2011-06-02 HISTORY — DX: Headache: R51

## 2011-06-02 HISTORY — DX: Unspecified osteoarthritis, unspecified site: M19.90

## 2011-06-02 LAB — CBC
Hemoglobin: 13.8 g/dL (ref 13.0–17.0)
MCHC: 33.4 g/dL (ref 30.0–36.0)
Platelets: 230 10*3/uL (ref 150–400)
RDW: 12.8 % (ref 11.5–15.5)

## 2011-06-02 LAB — BASIC METABOLIC PANEL
GFR calc Af Amer: 88 mL/min — ABNORMAL LOW (ref >90)
GFR calc non Af Amer: 76 mL/min — ABNORMAL LOW (ref >90)
Glucose, Bld: 89 mg/dL (ref 70–99)
Potassium: 3.5 mEq/L (ref 3.5–5.1)
Sodium: 141 mEq/L (ref 135–145)

## 2011-06-02 LAB — SURGICAL PCR SCREEN
MRSA, PCR: NEGATIVE
Staphylococcus aureus: NEGATIVE

## 2011-06-02 NOTE — Patient Instructions (Addendum)
20 Akoni Parton  06/02/2011   Your procedure is scheduled on:  Tues. 06/07/2011  Report to [facility] at 0730 AM.  Call this number if you have problems the morning of surgery: [phone]   Remember:Follow full rectal prep instructions from Dr. Jacinto Halim office   Do not eat food:After Midnight.  Do not drink clear liquids: After Midnight.  Take these medicines the morning of surgery with A SIP OF WATER: lipitor   Do not wear jewelry, make-up or nail polish.  Do not wear lotions, powders, or perfumes.   Do not shave 48 hours prior to surgery.  Do not bring valuables to the hospital.  Contacts, dentures or bridgework may not be worn into surgery.  Leave suitcase in the car. After surgery it may be brought to your room.  For patients admitted to the hospital, checkout time is 11:00 AM the day of discharge.   Patients discharged the day of surgery will not be allowed to drive home.  Name and phone number of your driver: Loel Ro [phone]  Special Instructions: CHG Shower Use Special Wash: 1/2 bottle night before surgery and 1/2 bottle morning of surgery.   Please read over the following fact sheets that you were given: MRSA Information

## 2011-06-06 ENCOUNTER — Other Ambulatory Visit (INDEPENDENT_AMBULATORY_CARE_PROVIDER_SITE_OTHER): Payer: Self-pay | Admitting: General Surgery

## 2011-06-06 MED ORDER — AMLODIPINE BESYLATE 10 MG PO TABS
10.0000 mg | ORAL_TABLET | Freq: Every day | ORAL | Status: DC
  Administered 2011-06-07: 10 mg via ORAL
  Filled 2011-06-06 (×2): qty 1

## 2011-06-06 NOTE — H&P (Signed)
[name]   05/11/2011 11:30 AM Office Visit  MRN: [id number]   Description: 65 year old male  Provider: Ernestene Mention, MD  Department: [facility]        Diagnoses     Hemorrhoids, external with complications   - Primary    455.5    Hemorrhoids, internal     455.0      Reason for Visit     Rectal Problems    Eval of hemorrhoids - self referred.        Vitals - Last Recorded       BP Pulse Temp(Src) Resp Ht Wt    152/90  72  96.8 F (36 C) (Temporal)  16  5\' 8"  (1.727 m)  215 lb 9.6 oz (97.796 kg)          BMI              32.78 kg/m2                 Progress Notes     Ernestene Mention, MD  05/11/2011 12:39 PM  SignedChief Complaint   Patient presents with   .  Rectal Problems       Eval of hemorrhoids - self referred.      HPI [name] is a 65 y.o. male.     This is a 65 year old gentleman who is self-referred for management of hemorrhoids. His primary care physician is Dr. Loyal Jacobson in [facility].   The patient states he had a colonoscopy about 3 years ago in [place], probably at [facility]. He says nothing was found other than hemorrhoids. He complains of hemorrhoid symptoms for over a decade. His biggest problem is large external hemorrhoids which are very difficult to clean and he would like these removed. He does not have any pain. He rarely bleeds. He doesn't have any itching. Does not have  any protrusion of internal hemorrhoids. He states that he has been thinking about having  surgery for years and he would  like to do this sometime this year. HPI    Past Medical History   Diagnosis  Date   .  Genital herpes     .  Hypertension     .  Hyperlipidemia         Past Surgical History   Procedure  Date   .  Joint replacement  2011       left knee   .  Joint replacement  2009       right knee       Family History   Problem  Relation  Age of Onset   .  Stroke  Mother     .  Hypertension  Sister     .   Hypertension  Brother        Social History History   Substance Use Topics   .  Smoking status:  Former Smoker       Quit date:  05/10/1972   .  Smokeless tobacco:  Never Used   .  Alcohol Use:  No       Allergies   Allergen  Reactions   .  Penicillins  Hives       All over the body       Current Outpatient Prescriptions   Medication  Sig  Dispense  Refill   .  atorvastatin (LIPITOR) 20 MG tablet           .  VIAGRA 50 MG tablet           .  acyclovir (ZOVIRAX) 400 MG tablet  Take 400 mg by mouth 2 (two) times daily.           Marland Kitchen  amLODipine-benazepril (LOTREL) 10-20 MG per capsule  Take 1 capsule by mouth daily.           .  simvastatin (ZOCOR) 40 MG tablet  Take 40 mg by mouth at bedtime.              Review of Systems Review of Systems  Constitutional: Negative for fever, chills and unexpected weight change.  HENT: Negative for hearing loss, congestion, sore throat, trouble swallowing and voice change.   Eyes: Negative for visual disturbance.  Respiratory: Negative for cough and wheezing.   Cardiovascular: Negative for chest pain, palpitations and leg swelling.  Gastrointestinal: Negative for nausea, vomiting, abdominal pain, diarrhea, constipation, blood in stool, abdominal distention, anal bleeding and rectal pain.  Genitourinary: Negative for hematuria and difficulty urinating.  Musculoskeletal: Negative for arthralgias.  Skin: Negative for rash and wound.  Neurological: Negative for seizures, syncope, weakness and headaches.  Hematological: Negative for adenopathy. Does not bruise/bleed easily.  Psychiatric/Behavioral: Negative for confusion.    Blood pressure 152/90, pulse 72, temperature 96.8 F (36 C), temperature source Temporal, resp. rate 16, height 5\' 8"  (1.727 m), weight 215 lb 9.6 oz (97.796 kg).   Physical Exam Physical Exam  Constitutional: He is oriented to person, place, and time. He appears well-developed and well-nourished. No distress.  HENT:    Head: Normocephalic.   Nose: Nose normal.   Mouth/Throat: No oropharyngeal exudate.  Eyes: Conjunctivae and EOM are normal. Pupils are equal, round, and reactive to light. Right eye exhibits no discharge. Left eye exhibits no discharge. No scleral icterus.  Neck: Normal range of motion. Neck supple. No JVD present. No tracheal deviation present. No thyromegaly present.  Cardiovascular: Normal rate, regular rhythm, normal heart sounds and intact distal pulses.    No murmur heard. Pulmonary/Chest: Effort normal and breath sounds normal. No stridor. No respiratory distress. He has no wheezes. He has no rales. He exhibits no tenderness.  Abdominal: Soft. Bowel sounds are normal. He exhibits no distension and no mass. There is no tenderness. There is no rebound and no guarding.  Genitourinary:     Musculoskeletal: Normal range of motion. He exhibits no edema and no tenderness.  Lymphadenopathy:    He has no cervical adenopathy.  Neurological: He is alert and oriented to person, place, and time. He has normal reflexes. Coordination normal.  Skin: Skin is warm and dry. No rash noted. He is not diaphoretic. No erythema. No pallor.  Psychiatric: He has a normal mood and affect. His behavior is normal. Judgment and thought content normal.    Data Reviewed none   Assessment External hemorrhoids, left lateral and right anterior, complicated by 2 pain, occasional bleeding, and difficulty with cleansing.   Internal hemorrhoids, stage I.   Status post umbilical hernia repair.   History of urosepsis.   Hypertension   Status post bilateral total knee replacement.   Plan   We talked about different strategies for care. He would like to have these surgically removed. We will schedule for internal and external hemorrhoidectomy, 2 columns, left lateral and right anterior in the near future.   I have discussed indications and details of surgery with him. Risks and complications have been  outlined, including limited to bleeding, infection, temporary pain,  recurrence of hemorrhoids, rarely incontinence, and other  problems. He understands these issues well. All of his questions are answered. He agrees with this plan.       Ernestene Mention 05/11/2011, 12:31 PM                Not recorded             Patient Instructions     You have 2 large external hemorrhoids that are causing your symptoms. You also have some internal hemorrhoids. You will be scheduled for surgery to remove the hemorrhoids.   Hemorrhoidectomy Hemorrhoidectomy is surgery to remove hemorrhoids. Hemorrhoids are veins that have become swollen in the rectum. The rectum is the area from the bottom end of the intestines to the opening where bowel movements leave the body. Hemorrhoids can be uncomfortable. They can cause itching, bleeding and pain if a blood clot forms in them (thrombose). If hemorrhoids are small, surgery may not be needed. But if they cover a larger area, surgery is usually suggested.   LET YOUR CAREGIVER KNOW ABOUT:   Any allergies.   All medications you are taking, including:   Herbs, eyedrops, over-the-counter medications and creams.   Blood thinners (anticoagulants), aspirin or other drugs that could affect blood clotting.   Use of steroids (by mouth or as creams).   Previous problems with anesthetics, including local anesthetics.   Possibility of pregnancy, if this applies.   Any history of blood clots.   Any history of bleeding or other blood problems.   Previous surgery.   Smoking history.   Other health problems.  RISKS AND COMPLICATIONS All surgery carries some risk. However, hemorrhoid surgery usually goes smoothly. Possible complications could include: Urinary retention.   Bleeding.   Infection.   A painful incision.   A reaction to the anesthesia (this is not common).  BEFORE THE PROCEDURE   Stop using aspirin and non-steroidal anti-inflammatory drugs  (NSAIDs) for pain relief. This includes prescription drugs and over-the-counter drugs such as ibuprofen and naproxen. Also stop taking vitamin E. If possible, do this two weeks before your surgery.   If you take blood-thinners, ask your healthcare provider when you should stop taking them.   You will probably have blood and urine tests done several days before your surgery.   Do not eat or drink for about 8 hours before the surgery.   Arrive at least an hour before the surgery, or whenever your surgeon recommends. This will give you time to check in and fill out any needed paperwork.   Hemorrhoidectomy is often an outpatient procedure. This means you will be able to go home the same day. Sometimes, though, people stay overnight in the hospital after the procedure. Ask your surgeon what to expect. Either way, make arrangements in advance for someone to drive you home.  PROCEDURE   The preparation:   You will change into a hospital gown.   You will be given an IV. A needle will be inserted in your arm. Medication can flow directly into your body through this needle.   You might be given an enema to clear your rectum.   Once in the operating room, you will probably lie on your side or be repositioned later to lying on your stomach.   You will be given anesthesia (medication) so you will not feel anything during the surgery. The surgery often is done with local anesthesia (the area near the hemorrhoids will be numb and you will be drowsy  but awake). Sometimes, general anesthesia is used (you will be asleep during the procedure).   The procedure:   There are a few different procedures for hemorrhoids. Be sure to ask you surgeon about the procedure, the risks and benefits.   Be sure to ask about what you need to do to take care of the wound, if there is one.  AFTER THE PROCEDURE You will stay in a recovery area until the anesthesia has worn off. Your blood pressure and pulse will be checked every so  often.   You may feel a lot of pain in the area of the rectum.   Take all pain medication prescribed by your surgeon. Ask before taking any over-the-counter pain medicines.   Sometimes sitting in a warm bath can help relieve your pain.   To make sure you have bowel movements without straining:   You will probably need to take stool softeners (usually a pill) for a few days.   You should drink 8 to 10 glasses of water each day.   Your activity will be restricted for awhile. Ask your caregiver for a list of what you should and should not do while you recover.  Document Released: 05/01/2009 Document Revised: 03/16/2011 Document Reviewed: 05/01/2009 [facility] Patient Information 2012 [place], [place].       Level of Service     PR OFFICE/OUTPT VISIT,NEW,LEVL III [id number]         All Flowsheet Templates (all recorded)     Encounter Vitals Flowsheet    Custom Formula Data Flowsheet    Anthropometrics Flowsheet                      All Charges for This Encounter       Code Description Service Date Service Provider Modifiers Quantity    [id number] Anoscopy [46600] 05/11/2011 Ernestene Mention, MD   1    [phone] PR OFFICE/OUTPT VISIT,NEW,LEVL III 05/11/2011 Ernestene Mention, MD 25 1        Other Encounter Related Information     Allergies & Medications         Problem List         History         Patient-Entered Questionnaires     No data filed

## 2011-06-07 ENCOUNTER — Encounter (HOSPITAL_COMMUNITY): Payer: Self-pay | Admitting: Certified Registered Nurse Anesthetist

## 2011-06-07 ENCOUNTER — Ambulatory Visit (HOSPITAL_COMMUNITY): Payer: PRIVATE HEALTH INSURANCE | Admitting: Certified Registered Nurse Anesthetist

## 2011-06-07 ENCOUNTER — Encounter (HOSPITAL_COMMUNITY)
Admission: RE | Disposition: A | Discharge status: Home / Self Care | Payer: Self-pay | Source: Ambulatory Visit | Attending: General Surgery

## 2011-06-07 ENCOUNTER — Ambulatory Visit (HOSPITAL_COMMUNITY)
Admission: RE | Admit: 2011-06-07 | Discharge: 2011-06-08 | Disposition: A | Discharge status: Home / Self Care | Payer: PRIVATE HEALTH INSURANCE | Source: Ambulatory Visit | Attending: General Surgery | Admitting: General Surgery

## 2011-06-07 ENCOUNTER — Other Ambulatory Visit (INDEPENDENT_AMBULATORY_CARE_PROVIDER_SITE_OTHER): Payer: Self-pay | Admitting: General Surgery

## 2011-06-07 ENCOUNTER — Encounter (HOSPITAL_COMMUNITY): Payer: Self-pay | Admitting: *Deleted

## 2011-06-07 DIAGNOSIS — K649 Unspecified hemorrhoids: Secondary | ICD-10-CM

## 2011-06-07 DIAGNOSIS — E785 Hyperlipidemia, unspecified: Secondary | ICD-10-CM | POA: Insufficient documentation

## 2011-06-07 DIAGNOSIS — L909 Atrophic disorder of skin, unspecified: Secondary | ICD-10-CM | POA: Insufficient documentation

## 2011-06-07 DIAGNOSIS — K648 Other hemorrhoids: Secondary | ICD-10-CM | POA: Insufficient documentation

## 2011-06-07 DIAGNOSIS — Z96659 Presence of unspecified artificial knee joint: Secondary | ICD-10-CM | POA: Insufficient documentation

## 2011-06-07 DIAGNOSIS — Z87891 Personal history of nicotine dependence: Secondary | ICD-10-CM | POA: Insufficient documentation

## 2011-06-07 DIAGNOSIS — I10 Essential (primary) hypertension: Secondary | ICD-10-CM | POA: Insufficient documentation

## 2011-06-07 DIAGNOSIS — K644 Residual hemorrhoidal skin tags: Secondary | ICD-10-CM | POA: Insufficient documentation

## 2011-06-07 DIAGNOSIS — Z01812 Encounter for preprocedural laboratory examination: Secondary | ICD-10-CM | POA: Insufficient documentation

## 2011-06-07 HISTORY — PX: HEMORRHOID SURGERY: SHX153

## 2011-06-07 LAB — CREATININE, SERUM
Creatinine, Ser: 0.98 mg/dL (ref 0.50–1.35)
GFR calc non Af Amer: 85 mL/min — ABNORMAL LOW (ref >90)

## 2011-06-07 LAB — CBC
MCHC: 34.1 g/dL (ref 30.0–36.0)
RDW: 12.9 % (ref 11.5–15.5)

## 2011-06-07 SURGERY — HEMORRHOIDECTOMY
Anesthesia: General | Site: Rectum | Wound class: Clean Contaminated

## 2011-06-07 MED ORDER — HYALURONIDASE OVINE 200 UNIT/ML IJ SOLN
INTRAMUSCULAR | Status: DC | PRN
  Administered 2011-06-07: 200 [IU] via SUBCUTANEOUS

## 2011-06-07 MED ORDER — HEPARIN SODIUM (PORCINE) 5000 UNIT/ML IJ SOLN
5000.0000 [IU] | Freq: Three times a day (TID) | INTRAMUSCULAR | Status: DC
  Administered 2011-06-07 – 2011-06-08 (×2): 5000 [IU] via SUBCUTANEOUS
  Filled 2011-06-07 (×5): qty 1

## 2011-06-07 MED ORDER — HEPARIN SODIUM (PORCINE) 5000 UNIT/ML IJ SOLN
5000.0000 [IU] | INTRAMUSCULAR | Status: AC
  Administered 2011-06-07: 5000 [IU] via SUBCUTANEOUS
  Filled 2011-06-07: qty 1

## 2011-06-07 MED ORDER — AMLODIPINE BESY-BENAZEPRIL HCL 10-20 MG PO CAPS: 1.0000 | ORAL_CAPSULE | ORAL | Status: DC

## 2011-06-07 MED ORDER — MIDAZOLAM HCL 5 MG/5ML IJ SOLN
INTRAMUSCULAR | Status: DC | PRN
  Administered 2011-06-07: 2 mg via INTRAVENOUS

## 2011-06-07 MED ORDER — LACTATED RINGERS IV SOLN: INTRAVENOUS | Status: DC

## 2011-06-07 MED ORDER — FENTANYL CITRATE 0.05 MG/ML IJ SOLN
INTRAMUSCULAR | Status: AC
  Filled 2011-06-07: qty 2

## 2011-06-07 MED ORDER — PROPOFOL 10 MG/ML IV EMUL
INTRAVENOUS | Status: DC | PRN
  Administered 2011-06-07: 300 mg via INTRAVENOUS

## 2011-06-07 MED ORDER — SODIUM CHLORIDE 0.9 % IJ SOLN
INTRAMUSCULAR | Status: DC | PRN
  Administered 2011-06-07: 20 mL

## 2011-06-07 MED ORDER — HYDROCODONE-ACETAMINOPHEN 5-325 MG PO TABS: 1.0000 | ORAL_TABLET | ORAL | Status: DC | PRN

## 2011-06-07 MED ORDER — DOCUSATE SODIUM 100 MG PO CAPS
100.0000 mg | ORAL_CAPSULE | Freq: Two times a day (BID) | ORAL | Status: DC
  Administered 2011-06-07 – 2011-06-08 (×3): 100 mg via ORAL
  Filled 2011-06-07 (×4): qty 1

## 2011-06-07 MED ORDER — SIMVASTATIN 40 MG PO TABS: 40.0000 mg | ORAL_TABLET | Freq: Every day | ORAL | Status: DC

## 2011-06-07 MED ORDER — BENAZEPRIL HCL 20 MG PO TABS
20.0000 mg | ORAL_TABLET | Freq: Every day | ORAL | Status: DC
  Administered 2011-06-08: 20 mg via ORAL
  Filled 2011-06-07: qty 1

## 2011-06-07 MED ORDER — BUPIVACAINE LIPOSOME 1.3 % IJ SUSP
INTRAMUSCULAR | Status: DC | PRN
  Administered 2011-06-07: 20 mL

## 2011-06-07 MED ORDER — HEPARIN SODIUM (PORCINE) 5000 UNIT/ML IJ SOLN
INTRAMUSCULAR | Status: AC
  Administered 2011-06-07: 5000 [IU] via SUBCUTANEOUS
  Filled 2011-06-07: qty 1

## 2011-06-07 MED ORDER — LIDOCAINE HCL (CARDIAC) 20 MG/ML IV SOLN
INTRAVENOUS | Status: DC | PRN
  Administered 2011-06-07: 100 mg via INTRAVENOUS

## 2011-06-07 MED ORDER — PROMETHAZINE HCL 25 MG/ML IJ SOLN: 6.2500 mg | INTRAMUSCULAR | Status: DC | PRN

## 2011-06-07 MED ORDER — FENTANYL CITRATE 0.05 MG/ML IJ SOLN
INTRAMUSCULAR | Status: DC | PRN
  Administered 2011-06-07: 50 ug via INTRAVENOUS

## 2011-06-07 MED ORDER — FENTANYL CITRATE 0.05 MG/ML IJ SOLN
25.0000 ug | INTRAMUSCULAR | Status: DC | PRN
  Administered 2011-06-07: 50 ug via INTRAVENOUS

## 2011-06-07 MED ORDER — LIDOCAINE HCL 1 % IJ SOLN
INTRAMUSCULAR | Status: AC
  Filled 2011-06-07: qty 20

## 2011-06-07 MED ORDER — KCL IN DEXTROSE-NACL 20-5-0.45 MEQ/L-%-% IV SOLN
INTRAVENOUS | Status: DC
  Administered 2011-06-07 – 2011-06-08 (×2): via INTRAVENOUS
  Filled 2011-06-07 (×4): qty 1000

## 2011-06-07 MED ORDER — ATORVASTATIN CALCIUM 10 MG PO TABS
20.0000 mg | ORAL_TABLET | Freq: Every day | ORAL | Status: DC
  Filled 2011-06-07: qty 2

## 2011-06-07 MED ORDER — LACTATED RINGERS IV SOLN
INTRAVENOUS | Status: DC
  Administered 2011-06-07 (×2): via INTRAVENOUS
  Administered 2011-06-07: 1000 mL via INTRAVENOUS

## 2011-06-07 MED ORDER — HEPARIN SODIUM (PORCINE) 5000 UNIT/ML IJ SOLN
5000.0000 [IU] | Freq: Once | INTRAMUSCULAR | Status: AC
  Administered 2011-06-07: 5000 [IU] via SUBCUTANEOUS

## 2011-06-07 MED ORDER — BUPIVACAINE LIPOSOME 1.3 % IJ SUSP
20.0000 mL | Freq: Once | INTRAMUSCULAR | Status: DC
  Filled 2011-06-07: qty 20

## 2011-06-07 MED ORDER — LIDOCAINE-EPINEPHRINE 1 %-1:100000 IJ SOLN
INTRAMUSCULAR | Status: DC | PRN
  Administered 2011-06-07: 20 mL

## 2011-06-07 MED ORDER — DIBUCAINE 1 % RE OINT
TOPICAL_OINTMENT | RECTAL | Status: DC | PRN
  Administered 2011-06-07: 1 via RECTAL

## 2011-06-07 MED ORDER — ATORVASTATIN CALCIUM 10 MG PO TABS
20.0000 mg | ORAL_TABLET | Freq: Every day | ORAL | Status: DC
  Administered 2011-06-08: 20 mg via ORAL
  Filled 2011-06-07: qty 2

## 2011-06-07 MED ORDER — ONDANSETRON HCL 4 MG/2ML IJ SOLN
INTRAMUSCULAR | Status: DC | PRN
  Administered 2011-06-07: 4 mg via INTRAVENOUS

## 2011-06-07 MED ORDER — MORPHINE SULFATE 2 MG/ML IJ SOLN
2.0000 mg | INTRAMUSCULAR | Status: DC | PRN
  Administered 2011-06-07 (×3): 2 mg via INTRAVENOUS
  Filled 2011-06-07 (×3): qty 1

## 2011-06-07 MED ORDER — HYALURONIDASE OVINE 200 UNIT/ML IJ SOLN
INTRAMUSCULAR | Status: AC
  Filled 2011-06-07: qty 1.2

## 2011-06-07 MED ORDER — LIDOCAINE-EPINEPHRINE 1 %-1:100000 IJ SOLN
INTRAMUSCULAR | Status: AC
  Filled 2011-06-07: qty 1

## 2011-06-07 MED ORDER — SODIUM CHLORIDE 0.9 % IV SOLN
INTRAVENOUS | Status: AC
  Filled 2011-06-07: qty 50

## 2011-06-07 MED ORDER — DIBUCAINE 1 % RE OINT
TOPICAL_OINTMENT | RECTAL | Status: AC
  Filled 2011-06-07: qty 28

## 2011-06-07 MED ORDER — ACYCLOVIR 400 MG PO TABS
400.0000 mg | ORAL_TABLET | Freq: Two times a day (BID) | ORAL | Status: DC
  Administered 2011-06-07 – 2011-06-08 (×2): 400 mg via ORAL
  Filled 2011-06-07 (×3): qty 1

## 2011-06-07 MED ORDER — POLYETHYLENE GLYCOL 3350 17 G PO PACK
17.0000 g | PACK | Freq: Every day | ORAL | Status: DC
  Administered 2011-06-08: 12:00:00 via ORAL
  Filled 2011-06-07 (×2): qty 1

## 2011-06-07 MED ORDER — ATORVASTATIN CALCIUM 10 MG PO TABS
20.0000 mg | ORAL_TABLET | Freq: Every day | ORAL | Status: DC
  Filled 2011-06-07 (×2): qty 2

## 2011-06-07 MED ORDER — SIMVASTATIN 20 MG PO TABS: 20.0000 mg | ORAL_TABLET | Freq: Every day | ORAL | Status: DC

## 2011-06-07 MED ORDER — SODIUM CHLORIDE 0.9 % IV SOLN
1.0000 g | INTRAVENOUS | Status: AC
  Administered 2011-06-07: 1 g via INTRAVENOUS
  Filled 2011-06-07: qty 1

## 2011-06-07 MED ORDER — AMLODIPINE BESYLATE 10 MG PO TABS
10.0000 mg | ORAL_TABLET | Freq: Every day | ORAL | Status: DC
  Administered 2011-06-08: 10 mg via ORAL
  Filled 2011-06-07: qty 1

## 2011-06-07 MED ORDER — ONDANSETRON HCL 4 MG/2ML IJ SOLN: 4.0000 mg | Freq: Four times a day (QID) | INTRAMUSCULAR | Status: DC | PRN

## 2011-06-07 SURGICAL SUPPLY — 37 items
BLADE HEX COATED 2.75 (ELECTRODE) ×2 IMPLANT
BLADE SURG 15 STRL LF DISP TIS (BLADE) ×1 IMPLANT
BLADE SURG 15 STRL SS (BLADE) ×1
BRIEF STRETCH FOR OB PAD LRG (UNDERPADS AND DIAPERS) IMPLANT
CANISTER SUCTION 2500CC (MISCELLANEOUS) ×2 IMPLANT
CLOTH BEACON ORANGE TIMEOUT ST (SAFETY) ×2 IMPLANT
COVER MAYO STAND STRL (DRAPES) IMPLANT
DECANTER SPIKE VIAL GLASS SM (MISCELLANEOUS) ×2 IMPLANT
DRAPE LG THREE QUARTER DISP (DRAPES) ×2 IMPLANT
DRSG PAD ABDOMINAL 8X10 ST (GAUZE/BANDAGES/DRESSINGS) ×2 IMPLANT
ELECT REM PT RETURN 9FT ADLT (ELECTROSURGICAL) ×2
ELECTRODE REM PT RTRN 9FT ADLT (ELECTROSURGICAL) ×1 IMPLANT
GAUZE SPONGE 4X4 16PLY XRAY LF (GAUZE/BANDAGES/DRESSINGS) ×2 IMPLANT
GLOVE BIOGEL PI IND STRL 7.0 (GLOVE) ×2 IMPLANT
GLOVE BIOGEL PI INDICATOR 7.0 (GLOVE) ×2
GLOVE EUDERMIC 7 POWDERFREE (GLOVE) ×2 IMPLANT
GLOVE SURG SS PI 7.0 STRL IVOR (GLOVE) ×4 IMPLANT
GOWN STRL NON-REIN LRG LVL3 (GOWN DISPOSABLE) ×2 IMPLANT
GOWN STRL REIN XL XLG (GOWN DISPOSABLE) ×2 IMPLANT
HEMOSTAT SNOW SURGICEL 2X4 (HEMOSTASIS) IMPLANT
KIT BASIN OR (CUSTOM PROCEDURE TRAY) ×2 IMPLANT
LUBRICANT JELLY K Y 4OZ (MISCELLANEOUS) ×2 IMPLANT
NDL SAFETY ECLIPSE 18X1.5 (NEEDLE) IMPLANT
NEEDLE HYPO 18GX1.5 SHARP (NEEDLE)
NEEDLE HYPO 25X1 1.5 SAFETY (NEEDLE) ×4 IMPLANT
NS IRRIG 1000ML POUR BTL (IV SOLUTION) ×2 IMPLANT
PACK LITHOTOMY IV (CUSTOM PROCEDURE TRAY) ×2 IMPLANT
PENCIL BUTTON HOLSTER BLD 10FT (ELECTRODE) ×2 IMPLANT
SPONGE GAUZE 4X4 12PLY (GAUZE/BANDAGES/DRESSINGS) ×2 IMPLANT
SPONGE SURGIFOAM ABS GEL 100 (HEMOSTASIS) IMPLANT
SUT CHROMIC 2 0 SH (SUTURE) ×6 IMPLANT
SUT CHROMIC 3 0 SH 27 (SUTURE) IMPLANT
SUT MNCRL AB 4-0 PS2 18 (SUTURE) ×2 IMPLANT
SUT VIC AB 2-0 SH 18 (SUTURE) ×2 IMPLANT
SYR CONTROL 10ML LL (SYRINGE) ×2 IMPLANT
TOWEL OR 17X26 10 PK STRL BLUE (TOWEL DISPOSABLE) ×2 IMPLANT
YANKAUER SUCT BULB TIP 10FT TU (MISCELLANEOUS) ×2 IMPLANT

## 2011-06-07 NOTE — Anesthesia Preprocedure Evaluation (Addendum)
Anesthesia Evaluation  Patient identified by MRN, date of birth, ID band Patient awake    Reviewed: Allergy & Precautions, H&P , NPO status , Patient's Chart, lab work & pertinent test results  Airway Mallampati: II TM Distance: >3 FB Neck ROM: full    Dental  (+) Missing and Dental Advisory Given,    Pulmonary neg pulmonary ROS,  clear to auscultation  Pulmonary exam normal       Cardiovascular Exercise Tolerance: Good hypertension, On Medications neg cardio ROS regular Normal    Neuro/Psych Negative Neurological ROS  Negative Psych ROS   GI/Hepatic negative GI ROS, Neg liver ROS,   Endo/Other  Negative Endocrine ROS  Renal/GU negative Renal ROS  Genitourinary negative   Musculoskeletal   Abdominal Normal abdominal exam  (+)   Peds  Hematology negative hematology ROS (+)   Anesthesia Other Findings   Reproductive/Obstetrics negative OB ROS                          Anesthesia Physical Anesthesia Plan  ASA: II  Anesthesia Plan: General   Post-op Pain Management:    Induction: Intravenous  Airway Management Planned: LMA  Additional Equipment:   Intra-op Plan:   Post-operative Plan:   Informed Consent: I have reviewed the patients History and Physical, chart, labs and discussed the procedure including the risks, benefits and alternatives for the proposed anesthesia with the patient or authorized representative who has indicated his/her understanding and acceptance.   Dental Advisory Given  Plan Discussed with: CRNA and Surgeon  Anesthesia Plan Comments:        Anesthesia Quick Evaluation

## 2011-06-07 NOTE — Progress Notes (Signed)
Patient attempted to move his bowels several times with no success.  He did dislodge a 5-cc clot, and showed signs of active bleeding.  Dressing changed, and surgical site cleansed with normal saline.  Will continue to monitor.

## 2011-06-07 NOTE — Op Note (Signed)
Patient Name:   [name]  Date of Surgery:   06/07/2011  Pre op Diagnosis:   Internal and external hemorrhoids, 3 columns  Post op Diagnosis:   Internal and external hemorrhoids, 3 columns, 8 mm perineal skin tag  Procedure:    Internal and external hemorrhoidectomy, 3 columns. Excision of 8 mm perineal skin tag  Surgeon:   Angelia Mould. Derrell Lolling, M.D., FACS  Assistant:     Operative Indications:   This is a 65 year old-gentleman who has had significant external hemorrhoids for many years. His biggest problem is that his external hemorrhoids are very bulky and difficult to clean and he occasionally has some bleeding and protrusion from internal hemorrhoids but that is not the biggest problem he has. He has been evaluated as an outpatient and is found to have large external hemorrhoids right anterior, left lateral, and to a lesser degree at right posterior. He also has some internal hemorrhoids although they looked to be stage I by physical exam. In the operating room he was found to have a midline skin tag in the perineum more anterior and separate and hemorrhoids.  Operative Findings:     Procedure in Detail:   Following the induction of general endotracheal anesthesia, the patient was placed in lithotomy position with appropriate padding. Surgical time out was held.The perineal and perianal areas were prepped and draped in a sterile fashion. Large endoscope was inserted. I injected the internal hemorrhoids with one percent Xylocaine with epinephrine mixed with Wydase. Internal hemorrhoids were massaged. I then gently dilated the anal sphincters for about 3 or 4 minutes. I then injected exparel, 50% solution into the perianal sphincters.  I then excised the internal and external hemorrhoids in the right anterior, right posterior, and left lateral positions. I will dictate the technique once as it was identical. A transfixion suture of 2-0 chromic was placed at the upper end of the internal  hemorrhoids. Hemostats were used for traction. Using electrocautery and countertraction I excised all the internal/external hemorrhoids and cauterized all the bleeders. We were careful to visualize and protect the internal sphincters. We closed the anorectal mucosa with a running locking suture of 2-0 chromic and continued a simple running suture out onto the anoderm. This was done in the right anterior right posterior and left lateral positions. We were very careful to keep the anoscope in place and to leave a good skin and mucosal bridge in between all 3 of the suture lines. There was a small 8 mm skin tag in the midline of the perineum which I excised with cautery and closed with 4-0 Monocryl. I spent some time observing for bleeding. There was none after 5 minutes. Dibucaine ointment and 4 x 4 gauze and fishnet panties were placed. The patient was taken to recovery room in stable condition. EBL was 25 cc. Complications none. Counts were correct.   Ernestene Mention 06/07/2011 11:29 AM

## 2011-06-07 NOTE — Transfer of Care (Signed)
Immediate Anesthesia Transfer of Care Note  Patient: [name]  Procedure(s) Performed:  HEMORRHOIDECTOMY  Patient Location: PACU  Anesthesia Type: General  Level of Consciousness: sedated, patient cooperative and responds to stimulaton  Airway & Oxygen Therapy: Patient Spontanous Breathing and Patient connected to face mask oxgen  Post-op Assessment: Report given to PACU RN and Post -op Vital signs reviewed and stable  Post vital signs: Reviewed and stable  Complications: No apparent anesthesia complications

## 2011-06-07 NOTE — Anesthesia Postprocedure Evaluation (Signed)
  Anesthesia Post-op Note  Patient: [name]  Procedure(s) Performed:  HEMORRHOIDECTOMY  Patient Location: PACU  Anesthesia Type: General  Level of Consciousness: awake and alert   Airway and Oxygen Therapy: Patient Spontanous Breathing  Post-op Pain: mild  Post-op Assessment: Post-op Vital signs reviewed, Patient's Cardiovascular Status Stable, Respiratory Function Stable, Patent Airway and No signs of Nausea or vomiting  Post-op Vital Signs: stable  Complications: No apparent anesthesia complications

## 2011-06-07 NOTE — Progress Notes (Signed)
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  Plan: I have substituted atorvastatin 20mg  for the bedtime dose of simvastatin.   Please consider this therapeutic duplication and potential drug-drug interaction at discharge.  [name] 06/07/2011 3:20 PM

## 2011-06-07 NOTE — H&P (View-Only) (Signed)
  [name]   05/11/2011 11:30 AM Office Visit  MRN: [id number]   Description: 65 year old male  Provider: Elizzie Westergard M, MD  Department: [facility]        Diagnoses     Hemorrhoids, external with complications   - Primary    455.5    Hemorrhoids, internal     455.0      Reason for Visit     Rectal Problems    Eval of hemorrhoids - self referred.        Vitals - Last Recorded       BP Pulse Temp(Src) Resp Ht Wt    152/90  72  96.8 F (36 C) (Temporal)  16  5' 8" (1.727 m)  215 lb 9.6 oz (97.796 kg)          BMI              32.78 kg/m2                 Progress Notes     Devarion Mcclanahan M, MD  05/11/2011 12:39 PM  SignedChief Complaint   Patient presents with   .  Rectal Problems       Eval of hemorrhoids - self referred.      HPI [name] is a 65 y.o. male.     This is a 65-year-old gentleman who is self-referred for management of hemorrhoids. His primary care physician is Dr. Michael Kalish in [place].   The patient states he had a colonoscopy about 3 years ago in [place], probably at [facility]. He says nothing was found other than hemorrhoids. He complains of hemorrhoid symptoms for over a decade. His biggest problem is large external hemorrhoids which are very difficult to clean and he would like these removed. He does not have any pain. He rarely bleeds. He doesn't have any itching. Does not have  any protrusion of internal hemorrhoids. He states that he has been thinking about having  surgery for years and he would  like to do this sometime this year. HPI    Past Medical History   Diagnosis  Date   .  Genital herpes     .  Hypertension     .  Hyperlipidemia         Past Surgical History   Procedure  Date   .  Joint replacement  2011       left knee   .  Joint replacement  2009       right knee       Family History   Problem  Relation  Age of Onset   .  Stroke  Mother     .  Hypertension  Sister     .   Hypertension  Brother        Social History History   Substance Use Topics   .  Smoking status:  Former Smoker       Quit date:  05/10/1972   .  Smokeless tobacco:  Never Used   .  Alcohol Use:  No       Allergies   Allergen  Reactions   .  Penicillins  Hives       All over the body       Current Outpatient Prescriptions   Medication  Sig  Dispense  Refill   .  atorvastatin (LIPITOR) 20 MG tablet           .    VIAGRA 50 MG tablet           .  acyclovir (ZOVIRAX) 400 MG tablet  Take 400 mg by mouth 2 (two) times daily.           .  amLODipine-benazepril (LOTREL) 10-20 MG per capsule  Take 1 capsule by mouth daily.           .  simvastatin (ZOCOR) 40 MG tablet  Take 40 mg by mouth at bedtime.              Review of Systems Review of Systems  Constitutional: Negative for fever, chills and unexpected weight change.  HENT: Negative for hearing loss, congestion, sore throat, trouble swallowing and voice change.   Eyes: Negative for visual disturbance.  Respiratory: Negative for cough and wheezing.   Cardiovascular: Negative for chest pain, palpitations and leg swelling.  Gastrointestinal: Negative for nausea, vomiting, abdominal pain, diarrhea, constipation, blood in stool, abdominal distention, anal bleeding and rectal pain.  Genitourinary: Negative for hematuria and difficulty urinating.  Musculoskeletal: Negative for arthralgias.  Skin: Negative for rash and wound.  Neurological: Negative for seizures, syncope, weakness and headaches.  Hematological: Negative for adenopathy. Does not bruise/bleed easily.  Psychiatric/Behavioral: Negative for confusion.    Blood pressure 152/90, pulse 72, temperature 96.8 F (36 C), temperature source Temporal, resp. rate 16, height 5' 8" (1.727 m), weight 215 lb 9.6 oz (97.796 kg).   Physical Exam Physical Exam  Constitutional: He is oriented to person, place, and time. He appears well-developed and well-nourished. No distress.  HENT:    Head: Normocephalic.   Nose: Nose normal.   Mouth/Throat: No oropharyngeal exudate.  Eyes: Conjunctivae and EOM are normal. Pupils are equal, round, and reactive to light. Right eye exhibits no discharge. Left eye exhibits no discharge. No scleral icterus.  Neck: Normal range of motion. Neck supple. No JVD present. No tracheal deviation present. No thyromegaly present.  Cardiovascular: Normal rate, regular rhythm, normal heart sounds and intact distal pulses.    No murmur heard. Pulmonary/Chest: Effort normal and breath sounds normal. No stridor. No respiratory distress. He has no wheezes. He has no rales. He exhibits no tenderness.  Abdominal: Soft. Bowel sounds are normal. He exhibits no distension and no mass. There is no tenderness. There is no rebound and no guarding.  Genitourinary:     Musculoskeletal: Normal range of motion. He exhibits no edema and no tenderness.  Lymphadenopathy:    He has no cervical adenopathy.  Neurological: He is alert and oriented to person, place, and time. He has normal reflexes. Coordination normal.  Skin: Skin is warm and dry. No rash noted. He is not diaphoretic. No erythema. No pallor.  Psychiatric: He has a normal mood and affect. His behavior is normal. Judgment and thought content normal.    Data Reviewed none   Assessment External hemorrhoids, left lateral and right anterior, complicated by 2 pain, occasional bleeding, and difficulty with cleansing.   Internal hemorrhoids, stage I.   Status post umbilical hernia repair.   History of urosepsis.   Hypertension   Status post bilateral total knee replacement.   Plan   We talked about different strategies for care. He would like to have these surgically removed. We will schedule for internal and external hemorrhoidectomy, 2 columns, left lateral and right anterior in the near future.   I have discussed indications and details of surgery with him. Risks and complications have been  outlined, including limited to bleeding, infection, temporary pain,   recurrence of hemorrhoids, rarely incontinence, and other  problems. He understands these issues well. All of his questions are answered. He agrees with this plan.       Mansfield Dann M 05/11/2011, 12:31 PM                Not recorded             Patient Instructions     You have 2 large external hemorrhoids that are causing your symptoms. You also have some internal hemorrhoids. You will be scheduled for surgery to remove the hemorrhoids.   Hemorrhoidectomy Hemorrhoidectomy is surgery to remove hemorrhoids. Hemorrhoids are veins that have become swollen in the rectum. The rectum is the area from the bottom end of the intestines to the opening where bowel movements leave the body. Hemorrhoids can be uncomfortable. They can cause itching, bleeding and pain if a blood clot forms in them (thrombose). If hemorrhoids are small, surgery may not be needed. But if they cover a larger area, surgery is usually suggested.   LET YOUR CAREGIVER KNOW ABOUT:   Any allergies.   All medications you are taking, including:   Herbs, eyedrops, over-the-counter medications and creams.   Blood thinners (anticoagulants), aspirin or other drugs that could affect blood clotting.   Use of steroids (by mouth or as creams).   Previous problems with anesthetics, including local anesthetics.   Possibility of pregnancy, if this applies.   Any history of blood clots.   Any history of bleeding or other blood problems.   Previous surgery.   Smoking history.   Other health problems.  RISKS AND COMPLICATIONS All surgery carries some risk. However, hemorrhoid surgery usually goes smoothly. Possible complications could include: Urinary retention.   Bleeding.   Infection.   A painful incision.   A reaction to the anesthesia (this is not common).  BEFORE THE PROCEDURE   Stop using aspirin and non-steroidal anti-inflammatory drugs  (NSAIDs) for pain relief. This includes prescription drugs and over-the-counter drugs such as ibuprofen and naproxen. Also stop taking vitamin E. If possible, do this two weeks before your surgery.   If you take blood-thinners, ask your healthcare provider when you should stop taking them.   You will probably have blood and urine tests done several days before your surgery.   Do not eat or drink for about 8 hours before the surgery.   Arrive at least an hour before the surgery, or whenever your surgeon recommends. This will give you time to check in and fill out any needed paperwork.   Hemorrhoidectomy is often an outpatient procedure. This means you will be able to go home the same day. Sometimes, though, people stay overnight in the hospital after the procedure. Ask your surgeon what to expect. Either way, make arrangements in advance for someone to drive you home.  PROCEDURE   The preparation:   You will change into a hospital gown.   You will be given an IV. A needle will be inserted in your arm. Medication can flow directly into your body through this needle.   You might be given an enema to clear your rectum.   Once in the operating room, you will probably lie on your side or be repositioned later to lying on your stomach.   You will be given anesthesia (medication) so you will not feel anything during the surgery. The surgery often is done with local anesthesia (the area near the hemorrhoids will be numb and you will be drowsy   but awake). Sometimes, general anesthesia is used (you will be asleep during the procedure).   The procedure:   There are a few different procedures for hemorrhoids. Be sure to ask you surgeon about the procedure, the risks and benefits.   Be sure to ask about what you need to do to take care of the wound, if there is one.  AFTER THE PROCEDURE You will stay in a recovery area until the anesthesia has worn off. Your blood pressure and pulse will be checked every so  often.   You may feel a lot of pain in the area of the rectum.   Take all pain medication prescribed by your surgeon. Ask before taking any over-the-counter pain medicines.   Sometimes sitting in a warm bath can help relieve your pain.   To make sure you have bowel movements without straining:   You will probably need to take stool softeners (usually a pill) for a few days.   You should drink 8 to 10 glasses of water each day.   Your activity will be restricted for awhile. Ask your caregiver for a list of what you should and should not do while you recover.  Document Released: 05/01/2009 Document Revised: 03/16/2011 Document Reviewed: 05/01/2009 [employer] Patient Information 2012 [employer]       Level of Service     PR OFFICE/OUTPT VISIT,NEW,LEVL III [99203]         All Flowsheet Templates (all recorded)     Encounter Vitals Flowsheet    Custom Formula Data Flowsheet    Anthropometrics Flowsheet                      All Charges for This Encounter       Code Description Service Date Service Provider Modifiers Quantity    46600 Anoscopy [46600] 05/11/2011 Jasalyn Frysinger M Johnica Armwood, MD   1    99203 PR OFFICE/OUTPT VISIT,NEW,LEVL III 05/11/2011 Bryceton Hantz M Shantil Vallejo, MD 25 1        Other Encounter Related Information     Allergies & Medications         Problem List         History         Patient-Entered Questionnaires     No data filed         

## 2011-06-07 NOTE — Progress Notes (Signed)
Rectal prep done 06/06/11 clear liq for dinner and fleets enema x2 06/07/11 with good results

## 2011-06-07 NOTE — Interval H&P Note (Signed)
History and Physical Interval Note:   06/07/2011   10:11 AM   [name]  has presented today for surgery, with the diagnosis of hemorrhoids  The various methods of treatment have been discussed with the patient and family. After consideration of risks, benefits and other options for treatment, the patient has consented to  Procedure(s): HEMORRHOIDECTOMY as a surgical intervention .  The patients' history has been reviewed, patient examined, no change in status, stable for surgery.  I have reviewed the patients' chart and labs.  Questions were answered to the patient's satisfaction.     Ernestene Mention  MD 06/07/2011 10:11 AM

## 2011-06-08 ENCOUNTER — Encounter (HOSPITAL_COMMUNITY): Payer: Self-pay | Admitting: General Surgery

## 2011-06-08 ENCOUNTER — Telehealth (INDEPENDENT_AMBULATORY_CARE_PROVIDER_SITE_OTHER): Payer: Self-pay

## 2011-06-08 MED ORDER — DSS 100 MG PO CAPS: 100.0000 mg | ORAL_CAPSULE | Freq: Two times a day (BID) | ORAL | Status: AC

## 2011-06-08 MED ORDER — HYDROCODONE-ACETAMINOPHEN 5-325 MG PO TABS: 1.0000 | ORAL_TABLET | ORAL | Status: AC | PRN

## 2011-06-08 NOTE — Telephone Encounter (Signed)
LMOM for pt to call for f/u appt.

## 2011-06-08 NOTE — Progress Notes (Signed)
1 Day Post-Op  Subjective: Patient is doing well. Pain well-controlled. Voiding uneventfully. Tolerating diet. Ambulatory. Reports no bleeding.  Objective: Vital signs in last 24 hours: Temp:  [97.1 F (36.2 C)-98.9 F (37.2 C)] 98.4 F (36.9 C) (11/21 0545) Pulse Rate:  [60-85] 79  (11/21 0545) Resp:  [7-20] 18  (11/21 0545) BP: (129-171)/(56-104) 133/66 mmHg (11/21 0545) SpO2:  [91 %-100 %] 96 % (11/21 0545) Weight:  [215 lb 2.7 oz (97.6 kg)-218 lb (98.884 kg)] 215 lb 2.7 oz (97.6 kg) (11/21 0545) Last BM Date: 06/07/11  Intake/Output from previous day: 11/20 0701 - 11/21 0700 In: 3037 [P.O.:360; I.V.:2677] Out: 2050 [Urine:2025; Blood:25] Intake/Output this shift: Total I/O In: 1537 [P.O.:360; I.V.:1177] Out: 950 [Urine:950]  Male genitalia: normal, rectal exam shows 3 healing suture lines. No bleeding. No swelling. No skin necrosis. There were complications.  Lab Results:   Basename 06/07/11 1254  WBC 7.2  HGB 13.8  HCT 40.5  PLT 215   BMET  Basename 06/07/11 1254  NA --  K --  CL --  CO2 --  GLUCOSE --  BUN --  CREATININE 0.98  CALCIUM --   PT/INR No results found for this basename: LABPROT:2,INR:2 in the last 72 hours ABG No results found for this basename: PHART:2,PCO2:2,PO2:2,HCO3:2 in the last 72 hours  Studies/Results: No results found.  Anti-infectives: Anti-infectives     Start     Dose/Rate Route Frequency Ordered Stop   06/07/11 1600   acyclovir (ZOVIRAX) tablet 400 mg        400 mg Oral 2 times daily 06/07/11 1501     06/07/11 0715   ertapenem (INVANZ) 1 g in sodium chloride 0.9 % 50 mL IVPB        1 g 100 mL/hr over 30 Minutes Intravenous 60 min pre-op 06/07/11 0711 06/07/11 1016          Assessment/Plan: s/p Procedure(s): HEMORRHOIDECTOMY Discharge See dictated discharge summary. See Discharge instructions  LOS: 1 day    [name] 06/08/2011

## 2011-06-08 NOTE — Discharge Summary (Signed)
  Patient ID: [name] [id number] 65 y.o. 1946-07-05  06/07/2011  Discharge date and time: No discharge date for patient encounter.  Admitting Physician: Ernestene Mention  Discharge Physician: Ernestene Mention  Admission Diagnoses: hemorrhoids  Discharge Diagnoses: internal and external hemorrhoids Perineal skin tag Hypertension Status post bilateral total knee replacement  Operations: Procedure(s): HEMORRHOIDECTOMY  Admission Condition: good  Discharged Condition: good  Indication for Admission:  This patient has had symptoms from internal and external hemorrhoids for some time. He was evaluated as an outpatient and found to have very large, bulky, circumferential hemorrhoids. The external component was larger than the internal component. Options for management were discussed and he requested to proceed with definitive surgical excision.  Hospital Course:   The patient was brought into the hospital and taken directly to the operating room. He underwent internal and external hemorrhoidectomy, 3 columns were excised. He also had a perineal skin tag which was excised. The surgery was uneventful. He was hospitalized overnight for observation. Pain control was good. He was able to void. He had no nausea vomiting or bleeding. Examination of the the morning after surgery showed healing wounds without any swelling or bleeding or complication. He wanted to go home and was discharged home. He was given instructions in wound care diet and stool softeners. He was asked to return to see me in 4 weeks.  Consults: none  Significant Diagnostic Studies: none  Treatments: IV hydration  Disposition: Home  Patient Instructions:   [name], [name]  Home Medication Instructions [id number]   Printed on:06/08/11 0640  Medication Information                    acyclovir (ZOVIRAX) 400 MG tablet Take 400 mg by mouth 2 (two) times daily.            amLODipine-benazepril (LOTREL) 10-20 MG  per capsule Take 1 capsule by mouth every morning.            simvastatin (ZOCOR) 40 MG tablet Take 40 mg by mouth at bedtime.            atorvastatin (LIPITOR) 20 MG tablet Take 20 mg by mouth every morning.            VIAGRA 50 MG tablet Take 50 mg by mouth as needed.            Aspirin-Salicylamide-Caffeine (BC HEADACHE POWDER PO) Take 1 packet by mouth as needed. pain           docusate sodium 100 MG CAPS Take 100 mg by mouth 2 (two) times daily.           HYDROcodone-acetaminophen (NORCO) 5-325 MG per tablet Take 1-2 tablets by mouth every 4 (four) hours as needed.             Activity: no sports or heavy lifting for 2 weeks. Return to work in 2 weeks. Diet: high-fiber, low-fat, low-cholesterol diet. Hydration was stressed Wound Care: as directed  Follow-up:  With Dr. Hollie Beach 4 weeks in  .  Signed: Angelia Mould. Derrell Lolling, M.D., FACS General and minimally invasive surgery Breast and Colorectal Surgery  06/08/2011, 6:40 AM

## 2011-07-04 ENCOUNTER — Encounter (INDEPENDENT_AMBULATORY_CARE_PROVIDER_SITE_OTHER): Payer: Self-pay | Admitting: General Surgery

## 2011-07-04 ENCOUNTER — Ambulatory Visit (INDEPENDENT_AMBULATORY_CARE_PROVIDER_SITE_OTHER): Payer: PRIVATE HEALTH INSURANCE | Admitting: General Surgery

## 2011-07-04 VITALS — BP 166/84 | HR 80 | Temp 98.8°F | Resp 20 | Ht 68.0 in | Wt 215.0 lb

## 2011-07-04 DIAGNOSIS — Z9889 Other specified postprocedural states: Secondary | ICD-10-CM

## 2011-07-04 NOTE — Progress Notes (Signed)
Subjective:     Patient ID: [name], male   DOB: 08-02-1945, 65 y.o.   MRN: [id number]  HPI This gentleman underwent internal and external hemorrhoidectomy, 3 columns, on June 07, 2011. He is doing very well. His bowel movements are staying soft. He has no pain whatsoever. He sees only a spot of blood from time to time. He is pleased with his progress at this point.  Review of Systems     Objective:   Physical Exam The patient looks well. In good spirits. In the distress.  One incision line on the right and one on the left are almost healed. Just a small amount of granulation tissue on each side. No tenderness. No purulence. No bleeding.    Assessment:     Internal and external hemorrhoids, recovering uneventfully following internal and external hemorrhoidectomy, 3 columns.    Plan:     Diet and activities discussed. I stressed hydration, high fiber diet.   Return to see me if he has any further symptoms.  Anticipate complete healing in 4-5 weeks.

## 2011-07-04 NOTE — Patient Instructions (Signed)
You are healing very well from your hemorrhoid surgery. Be sure to always drink lots of water. Be sure to always eat lots of vegitables and fruits and take supplemental fiber if necessary. You should heal completely in about 4 weeks. If you have any problems after 4 weeks, then return to see me. Otherwise return if there are further problems.

## 2011-12-22 IMAGING — CR DG CHEST 2V
2 series · 2 of 2 positions shown · non-contrast
Comparison: 04/09/2008

CLINICAL DATA: Left knee osteoarthritis.  Preop respiratory exam
for knee joint replacement.  Hypertension.

CHEST - 2 VIEW

[view not recorded (1 of 2)]
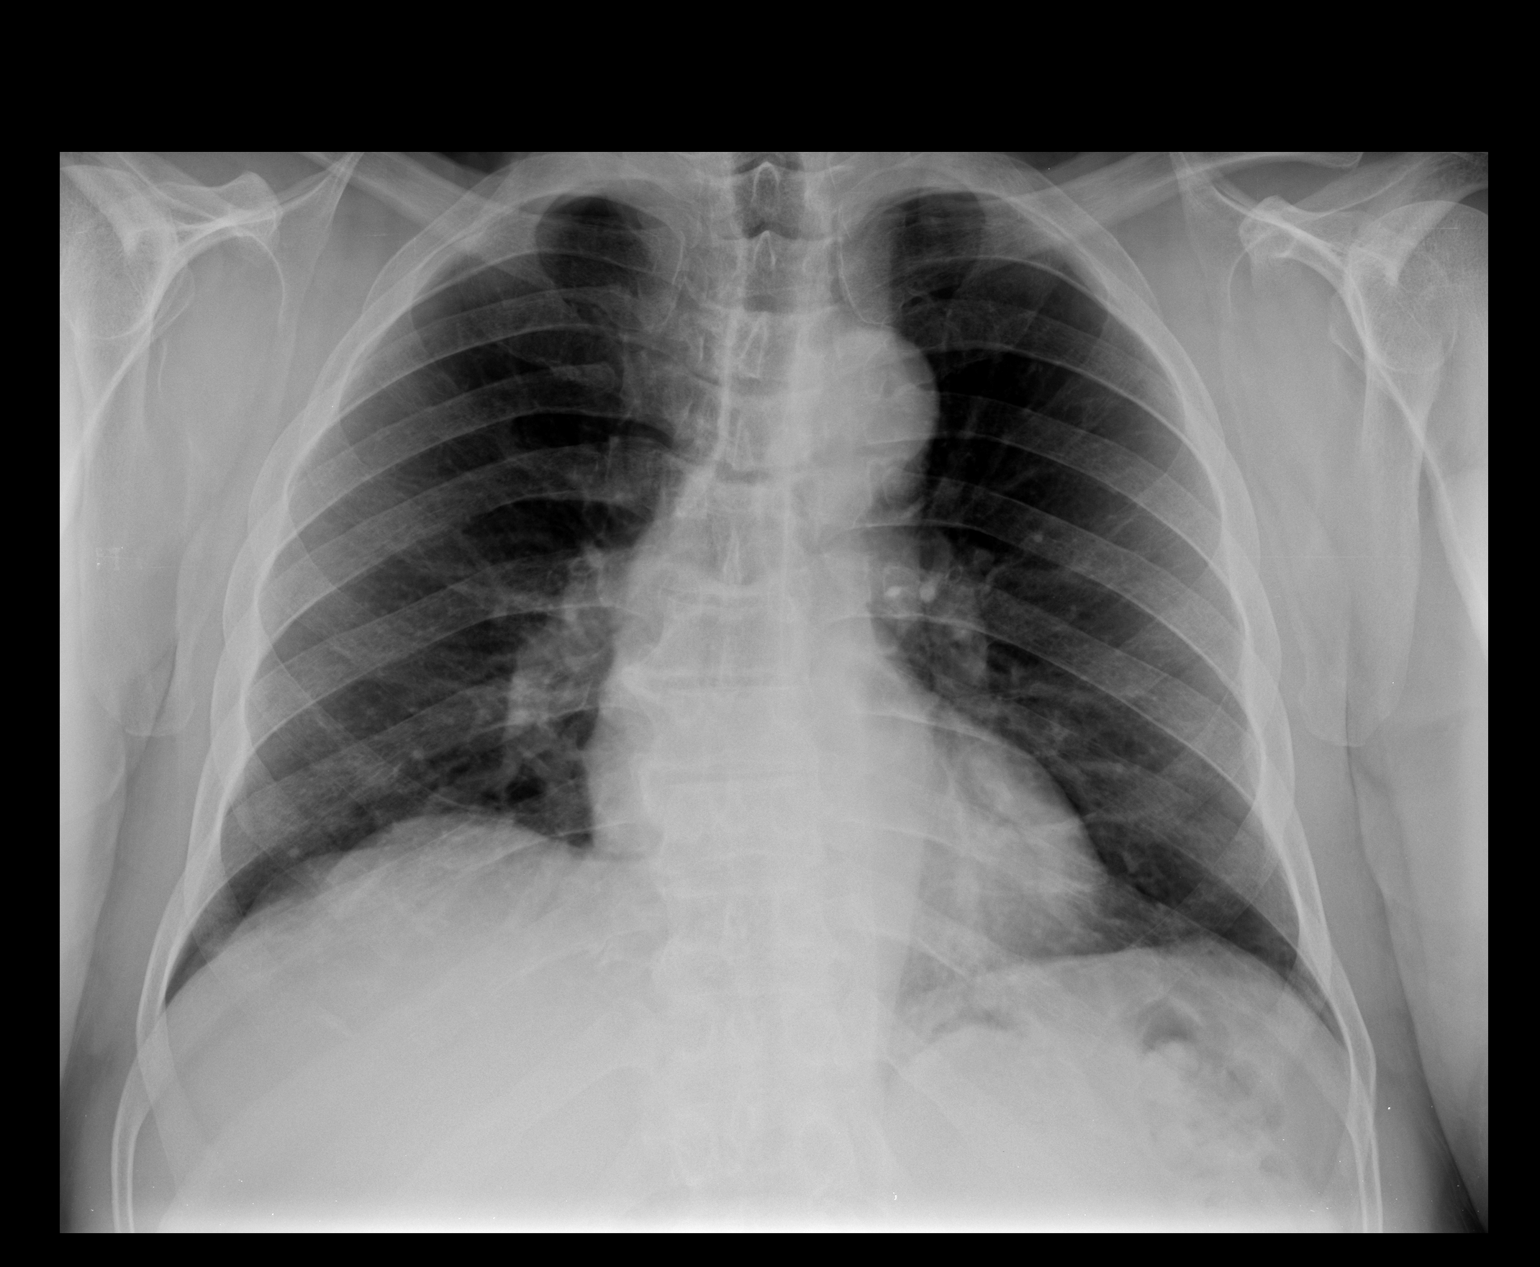

[view not recorded (2 of 2)]
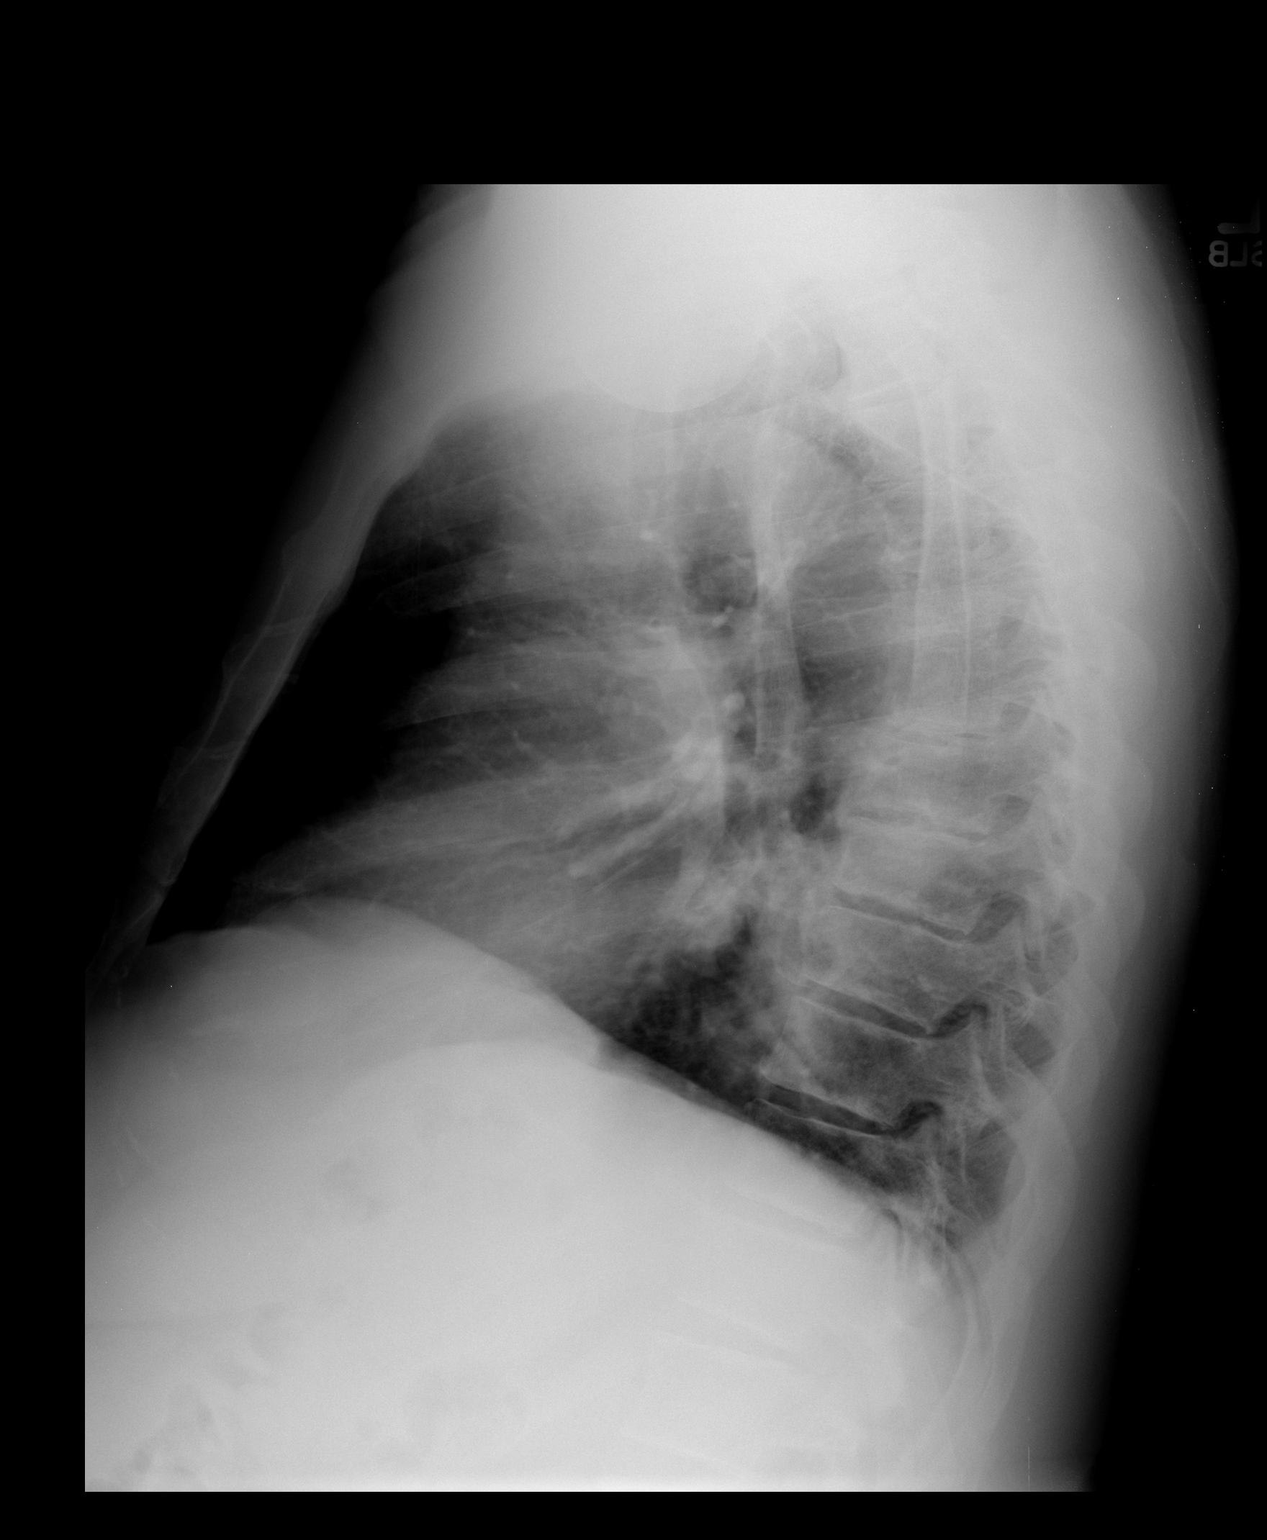

[2 of 2 positions shown; findings below may reference images not displayed]

FINDINGS: Heart size is normal.  Mild tortuosity of thoracic aorta
stable.  Both lungs are clear.  No evidence of pleural effusion.
No mass or adenopathy identified.
IMPRESSION: Stable exam.  No active disease.

## 2011-12-28 IMAGING — CR DG KNEE 1-2V PORT*L*
2 series · 2 of 2 positions shown · non-contrast
Comparison: None.

CLINICAL DATA: Postop left total knee placement; DJD

PORTABLE LEFT KNEE - 1-2 VIEW

[view not recorded (1 of 2)]
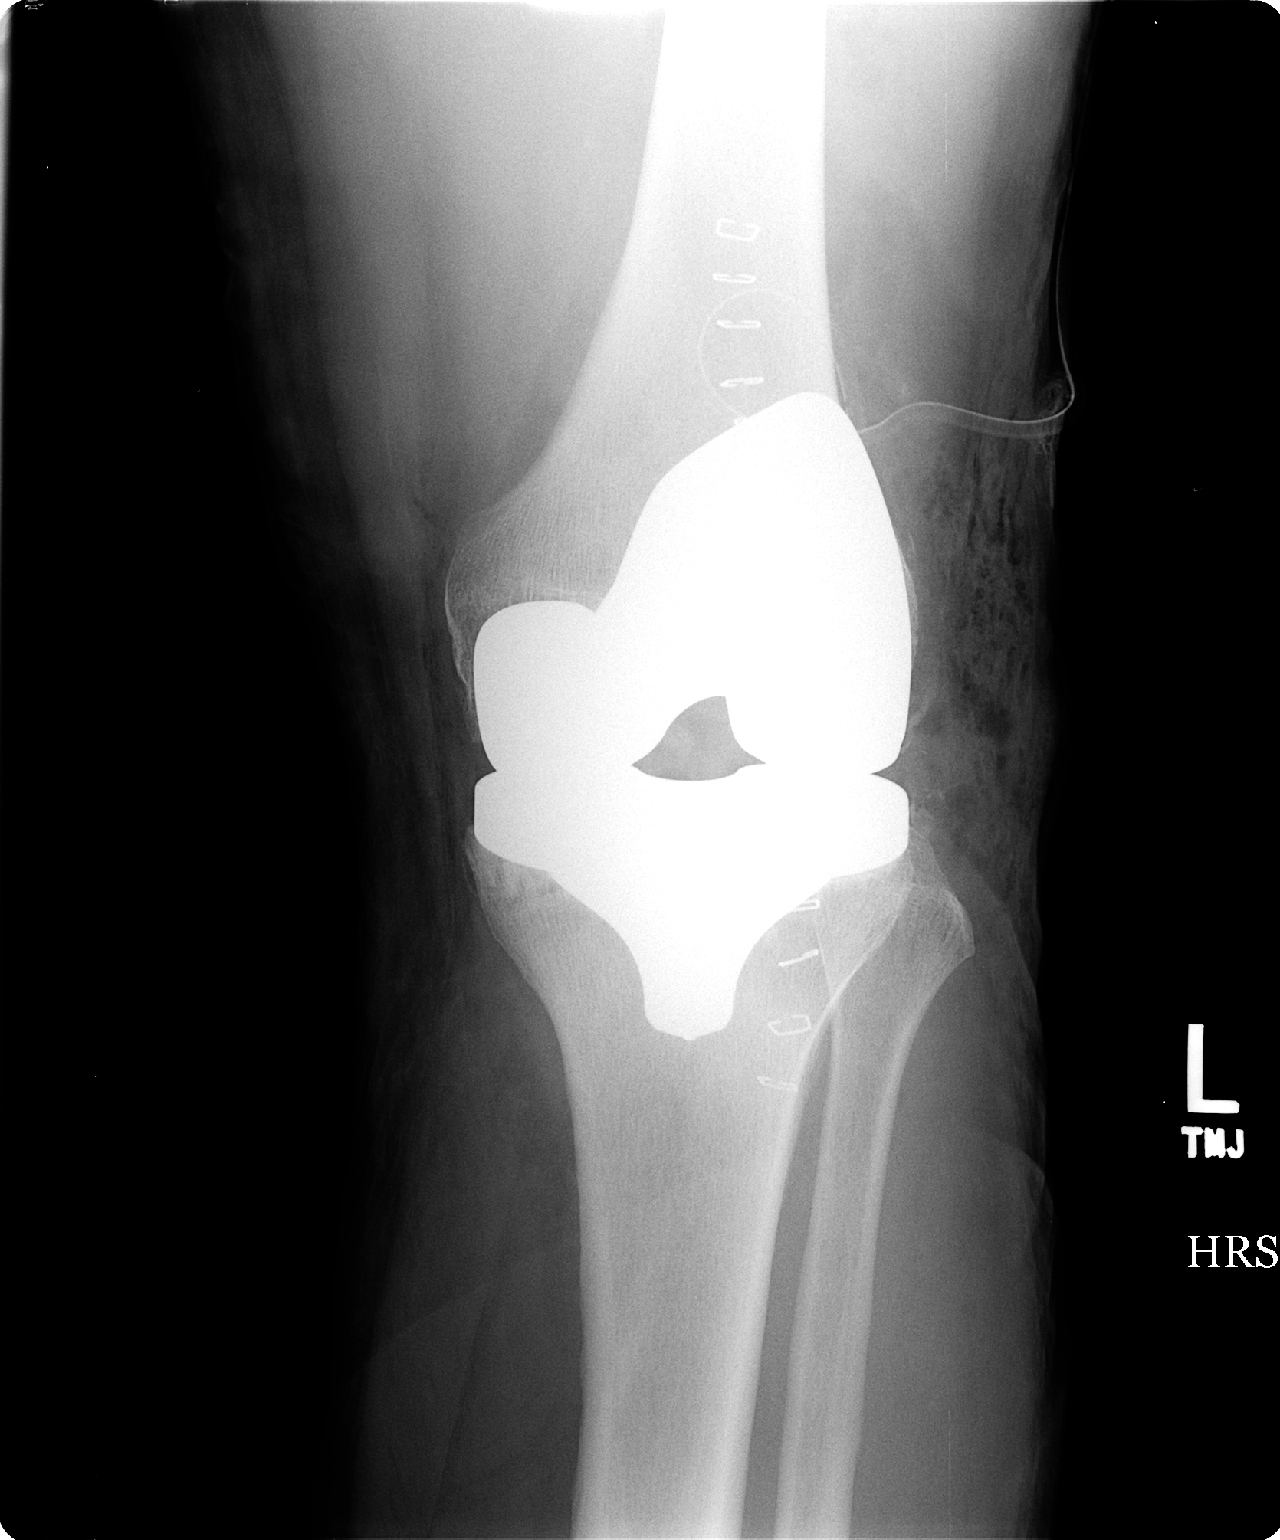

[view not recorded (2 of 2)]
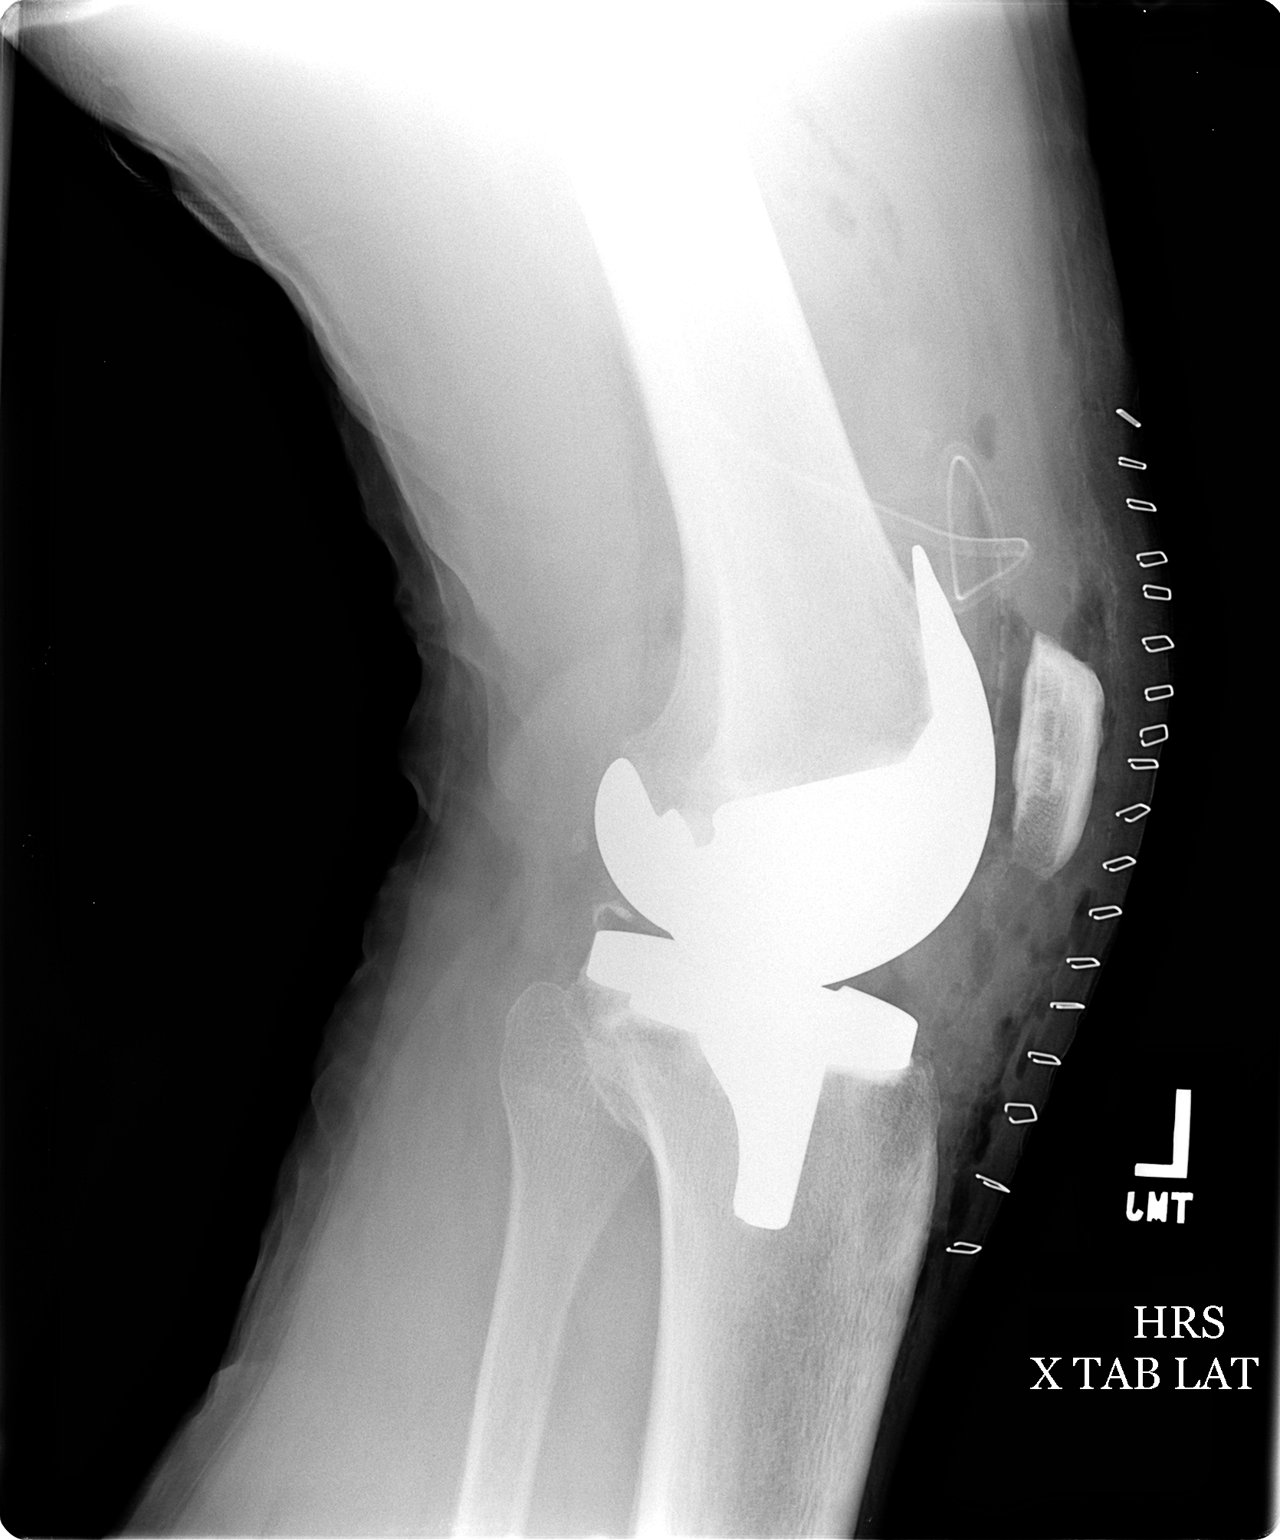

[2 of 2 positions shown; findings below may reference images not displayed]

FINDINGS: There is a left total knee replacement in anatomic
alignment.  No peri hardware lucency.  No fracture.  Postsurgical
drains and gas are present.
IMPRESSION: New left total knee prosthesis is anatomically aligned.

## 2012-04-10 ENCOUNTER — Other Ambulatory Visit (HOSPITAL_COMMUNITY): Payer: Self-pay | Admitting: Orthopedic Surgery

## 2012-04-10 DIAGNOSIS — M25461 Effusion, right knee: Secondary | ICD-10-CM

## 2012-04-10 DIAGNOSIS — M25561 Pain in right knee: Secondary | ICD-10-CM

## 2012-04-19 ENCOUNTER — Ambulatory Visit (HOSPITAL_COMMUNITY)
Admission: RE | Admit: 2012-04-19 | Discharge: 2012-04-19 | Disposition: A | Discharge status: Home / Self Care | Payer: PRIVATE HEALTH INSURANCE | Source: Ambulatory Visit | Attending: Orthopedic Surgery | Admitting: Orthopedic Surgery

## 2012-04-19 ENCOUNTER — Encounter (HOSPITAL_COMMUNITY): Payer: Self-pay

## 2012-04-19 ENCOUNTER — Encounter (HOSPITAL_COMMUNITY)
Admission: RE | Admit: 2012-04-19 | Discharge: 2012-04-19 | Disposition: A | Discharge status: Home / Self Care | Payer: PRIVATE HEALTH INSURANCE | Source: Ambulatory Visit | Attending: Orthopedic Surgery | Admitting: Orthopedic Surgery

## 2012-04-19 DIAGNOSIS — M25561 Pain in right knee: Secondary | ICD-10-CM

## 2012-04-19 DIAGNOSIS — Z96659 Presence of unspecified artificial knee joint: Secondary | ICD-10-CM | POA: Insufficient documentation

## 2012-04-19 DIAGNOSIS — M25569 Pain in unspecified knee: Secondary | ICD-10-CM | POA: Insufficient documentation

## 2012-04-19 DIAGNOSIS — M25461 Effusion, right knee: Secondary | ICD-10-CM

## 2012-04-19 MED ORDER — TECHNETIUM TC 99M MEDRONATE IV KIT
25.0000 | PACK | Freq: Once | INTRAVENOUS | Status: AC | PRN
  Administered 2012-04-19: 25 via INTRAVENOUS

## 2012-05-08 ENCOUNTER — Other Ambulatory Visit: Payer: Self-pay | Admitting: Orthopedic Surgery

## 2012-05-08 DIAGNOSIS — M5410 Radiculopathy, site unspecified: Secondary | ICD-10-CM

## 2012-05-08 DIAGNOSIS — M545 Low back pain: Secondary | ICD-10-CM

## 2012-05-12 ENCOUNTER — Ambulatory Visit
Admission: RE | Admit: 2012-05-12 | Discharge: 2012-05-12 | Disposition: A | Discharge status: Home / Self Care | Payer: PRIVATE HEALTH INSURANCE | Source: Ambulatory Visit | Attending: Orthopedic Surgery | Admitting: Orthopedic Surgery

## 2012-05-12 DIAGNOSIS — M545 Low back pain: Secondary | ICD-10-CM

## 2012-05-12 DIAGNOSIS — M5410 Radiculopathy, site unspecified: Secondary | ICD-10-CM

## 2012-09-09 ENCOUNTER — Encounter (HOSPITAL_COMMUNITY): Payer: Self-pay | Admitting: *Deleted

## 2012-09-09 ENCOUNTER — Emergency Department (HOSPITAL_COMMUNITY)
Admission: EM | Admit: 2012-09-09 | Discharge: 2012-09-09 | Disposition: A | Discharge status: Home / Self Care | Payer: Medicare Other | Attending: Emergency Medicine | Admitting: Emergency Medicine

## 2012-09-09 DIAGNOSIS — M129 Arthropathy, unspecified: Secondary | ICD-10-CM | POA: Insufficient documentation

## 2012-09-09 DIAGNOSIS — Z87891 Personal history of nicotine dependence: Secondary | ICD-10-CM | POA: Insufficient documentation

## 2012-09-09 DIAGNOSIS — Z79899 Other long term (current) drug therapy: Secondary | ICD-10-CM | POA: Insufficient documentation

## 2012-09-09 DIAGNOSIS — E785 Hyperlipidemia, unspecified: Secondary | ICD-10-CM | POA: Insufficient documentation

## 2012-09-09 DIAGNOSIS — R05 Cough: Secondary | ICD-10-CM | POA: Insufficient documentation

## 2012-09-09 DIAGNOSIS — R059 Cough, unspecified: Secondary | ICD-10-CM | POA: Insufficient documentation

## 2012-09-09 DIAGNOSIS — R509 Fever, unspecified: Secondary | ICD-10-CM | POA: Insufficient documentation

## 2012-09-09 DIAGNOSIS — Z8781 Personal history of (healed) traumatic fracture: Secondary | ICD-10-CM | POA: Insufficient documentation

## 2012-09-09 DIAGNOSIS — I10 Essential (primary) hypertension: Secondary | ICD-10-CM | POA: Insufficient documentation

## 2012-09-09 DIAGNOSIS — Z8619 Personal history of other infectious and parasitic diseases: Secondary | ICD-10-CM | POA: Insufficient documentation

## 2012-09-09 DIAGNOSIS — R109 Unspecified abdominal pain: Secondary | ICD-10-CM | POA: Insufficient documentation

## 2012-09-09 LAB — URINALYSIS, ROUTINE W REFLEX MICROSCOPIC
Bilirubin Urine: NEGATIVE
Glucose, UA: NEGATIVE mg/dL
Ketones, ur: NEGATIVE mg/dL
Nitrite: NEGATIVE
Specific Gravity, Urine: 1.014 (ref 1.005–1.030)
pH: 6.5 (ref 5.0–8.0)

## 2012-09-09 LAB — POCT I-STAT, CHEM 8
Chloride: 104 mEq/L (ref 96–112)
HCT: 43 % (ref 39.0–52.0)
Hemoglobin: 14.6 g/dL (ref 13.0–17.0)
Potassium: 3.6 mEq/L (ref 3.5–5.1)
Sodium: 143 mEq/L (ref 135–145)

## 2012-09-09 MED ORDER — IBUPROFEN 400 MG PO TABS
600.0000 mg | ORAL_TABLET | Freq: Once | ORAL | Status: AC
  Administered 2012-09-09: 600 mg via ORAL
  Filled 2012-09-09: qty 2

## 2012-09-09 NOTE — ED Provider Notes (Signed)
History     CSN: [id number]  Arrival date & time 09/09/12  1414   None     Chief Complaint  Patient presents with  . Abdominal Pain    (Consider location/radiation/quality/duration/timing/severity/associated sxs/prior treatment) Patient is a 67 y.o. male presenting with abdominal pain. The history is provided by the patient. No language interpreter was used.  Abdominal Pain Pain location:  R flank Pain quality: sharp   Pain radiates to:  Does not radiate Pain severity:  Moderate Onset quality:  Sudden Duration: seconds. Timing:  Intermittent Progression:  Unchanged Chronicity:  New Context: recent illness   Context comment:  Recent productive cough Relieved by:  Lying down Worsened by:  Movement and coughing Ineffective treatments:  None tried Associated symptoms: chills, cough and fever (low grade, <100)   Associated symptoms: no chest pain, no diarrhea, no dysuria, no fatigue, no hematuria, no nausea, no shortness of breath, no sore throat and no vomiting   Cough:    Cough characteristics:  Productive   Sputum characteristics:  Clear   Severity:  Moderate   Onset quality:  Gradual   Duration:  6 days   Timing:  Intermittent   Progression:  Unchanged   Chronicity:  New Risk factors: has not had multiple surgeries     Past Medical History  Diagnosis Date  . Genital herpes   . Hypertension   . Hyperlipidemia   . Headache   . Arthritis     both knees    Past Surgical History  Procedure Laterality Date  . Joint replacement  2011    left knee  . Joint replacement  2009    right knee  . Fracture surgery  1974    right  foot surgery  . Hemorrhoid surgery  06/07/2011    Procedure: HEMORRHOIDECTOMY;  Surgeon: Ernestene Mention, MD;  Location: [facility];  Service: General;  Laterality: N/A;    Family History  Problem Relation Age of Onset  . Stroke Mother   . Hypertension Sister   . Hypertension Brother     History  Substance Use Topics  . Smoking  status: Former Smoker -- 0.50 packs/day for 5 years    Types: Cigarettes    Quit date: 05/10/1972  . Smokeless tobacco: Never Used  . Alcohol Use: No      Review of Systems  Constitutional: Positive for fever (low grade, <100) and chills. Negative for diaphoresis, activity change, appetite change and fatigue.  HENT: Negative for congestion, sore throat, facial swelling, rhinorrhea, drooling, neck pain and voice change.   Respiratory: Positive for cough. Negative for shortness of breath and stridor.   Cardiovascular: Negative for chest pain.  Gastrointestinal: Positive for abdominal pain. Negative for nausea, vomiting, diarrhea and abdominal distention.  Endocrine: Negative for polydipsia and polyuria.  Genitourinary: Negative for dysuria, urgency, frequency, hematuria and decreased urine volume.  Musculoskeletal: Negative for back pain and gait problem.  Skin: Negative for color change and wound.  Neurological: Negative for facial asymmetry, weakness, numbness and headaches.  Hematological: Does not bruise/bleed easily.  Psychiatric/Behavioral: Negative for confusion and agitation.    Allergies  Penicillins  Home Medications   Current Outpatient Rx  Name  Route  Sig  Dispense  Refill  . acetaminophen (TYLENOL) 650 MG CR tablet   Oral   Take 1,300 mg by mouth every 8 (eight) hours as needed for pain.         Marland Kitchen acyclovir (ZOVIRAX) 400 MG tablet   Oral  Take 400 mg by mouth 2 (two) times daily.          Marland Kitchen amLODipine (NORVASC) 10 MG tablet   Oral   Take 10 mg by mouth daily.         Marland Kitchen atorvastatin (LIPITOR) 20 MG tablet   Oral   Take 20 mg by mouth every morning.          . benazepril (LOTENSIN) 20 MG tablet   Oral   Take 20 mg by mouth daily.         . benzonatate (TESSALON) 100 MG capsule   Oral   Take 100 mg by mouth every 6 (six) hours as needed for cough.         . chlorpheniramine-HYDROcodone (TUSSIONEX PENNKINETIC ER) 10-8 MG/5ML LQCR   Oral    Take 5 mLs by mouth at bedtime as needed (cough).         . naproxen sodium (ANAPROX) 220 MG tablet   Oral   Take 440 mg by mouth daily as needed. For pain         . Tamsulosin HCl (FLOMAX) 0.4 MG CAPS   Oral   Take 0.4 mg by mouth daily after supper.         Marland Kitchen VIAGRA 50 MG tablet   Oral   Take 50 mg by mouth as needed for erectile dysfunction.          . simvastatin (ZOCOR) 40 MG tablet   Oral   Take 40 mg by mouth at bedtime.            BP 156/84  Pulse 80  Temp(Src) 97.8 F (36.6 C) (Oral)  Resp 20  SpO2 96%  Physical Exam  Constitutional: He is oriented to person, place, and time. He appears well-developed and well-nourished. No distress.  HENT:  Head: Normocephalic and atraumatic.  Mouth/Throat: No oropharyngeal exudate.  Eyes: Pupils are equal, round, and reactive to light.  Neck: Normal range of motion. Neck supple.  Cardiovascular: Normal rate, regular rhythm and normal heart sounds.  Exam reveals no gallop and no friction rub.   No murmur heard. Pulmonary/Chest: Effort normal and breath sounds normal. No respiratory distress. He has no wheezes. He has no rales.  Abdominal: Soft. Bowel sounds are normal. He exhibits no distension and no mass. There is no tenderness. There is no rigidity, no rebound, no guarding and no CVA tenderness.  Musculoskeletal: Normal range of motion. He exhibits no edema and no tenderness.  Neurological: He is alert and oriented to person, place, and time.  Skin: Skin is warm and dry.  Psychiatric: He has a normal mood and affect.    ED Course  Procedures (including critical care time)  Labs Reviewed  URINALYSIS, ROUTINE W REFLEX MICROSCOPIC - Abnormal; Notable for the following:    APPearance HAZY (*)    All other components within normal limits  URINE CULTURE  POCT I-STAT, CHEM 8   No results found.   1. Muscular abdominal pain in right flank       MDM  Pt is a 67 y.o. male with pertinent PMHX of HTN, HLD who  presents with R flank pain since last night only w/ cough or movement.  Pt has had productive cough for 6 days w/ low grade fever, chills, no SOB, n/v, d/a, dysuria, hematuria or frequency.  PO intake does not change pain. Pt reports similar symptoms several years ago when he was diagnosed w/ UTI then subsequently developed urosepsis.  On PE, VSS, afebrile, in NAD.  Lungs clear after cough, NO abdominal ttp, NO CVA ttp.  Pt only had pain w/ cough and moving to sitting up to listen to lungs.  Given hx of UTI will send urine for UA/culture, as well as istat chem 8, although given PE I strongly suspect muscular pain.  Doubt appendicitis, nephrolithiasis, SBO.      3:41 UA not c/w infection. Cr stable.  Will d/c home with instructions for NSAID use, continued tx of cough w/ tessalon & cough syrup.    1. Muscular abdominal pain in right flank      Labs and imaging considered in decision making, reviewed by myself.  Imaging interpreted by radiology. Pt care discussed with my attending, Dr. Rhunette Croft.   Toy Cookey, MD 09/10/12 1518  I saw and evaluated the patient, reviewed the resident's note and I agree with the findings and plan.  Derwood Kaplan, MD 09/10/12 1535

## 2012-09-09 NOTE — ED Notes (Signed)
Pt reports RLQ pain and right side pain since yesterday, denies n/v/d, last bm was today, denies any urinary symptoms.

## 2012-09-11 LAB — URINE CULTURE: Colony Count: NO GROWTH

## 2013-12-24 DIAGNOSIS — R339 Retention of urine, unspecified: Secondary | ICD-10-CM | POA: Insufficient documentation

## 2013-12-24 DIAGNOSIS — I10 Essential (primary) hypertension: Secondary | ICD-10-CM | POA: Insufficient documentation

## 2013-12-24 DIAGNOSIS — B009 Herpesviral infection, unspecified: Secondary | ICD-10-CM | POA: Insufficient documentation

## 2013-12-24 DIAGNOSIS — M542 Cervicalgia: Secondary | ICD-10-CM | POA: Insufficient documentation

## 2013-12-24 DIAGNOSIS — M545 Low back pain, unspecified: Secondary | ICD-10-CM | POA: Insufficient documentation

## 2013-12-24 DIAGNOSIS — R069 Unspecified abnormalities of breathing: Secondary | ICD-10-CM | POA: Insufficient documentation

## 2013-12-24 DIAGNOSIS — N3941 Urge incontinence: Secondary | ICD-10-CM | POA: Insufficient documentation

## 2013-12-24 DIAGNOSIS — R739 Hyperglycemia, unspecified: Secondary | ICD-10-CM | POA: Insufficient documentation

## 2013-12-24 DIAGNOSIS — N529 Male erectile dysfunction, unspecified: Secondary | ICD-10-CM | POA: Insufficient documentation

## 2014-12-24 ENCOUNTER — Other Ambulatory Visit: Payer: Self-pay | Admitting: Sports Medicine

## 2014-12-24 DIAGNOSIS — M25512 Pain in left shoulder: Secondary | ICD-10-CM

## 2014-12-25 ENCOUNTER — Ambulatory Visit
Admission: RE | Admit: 2014-12-25 | Discharge: 2014-12-25 | Disposition: A | Discharge status: Home / Self Care | Payer: Medicare Other | Source: Ambulatory Visit | Attending: Sports Medicine | Admitting: Sports Medicine

## 2014-12-25 DIAGNOSIS — M25512 Pain in left shoulder: Secondary | ICD-10-CM

## 2015-01-28 DIAGNOSIS — M75122 Complete rotator cuff tear or rupture of left shoulder, not specified as traumatic: Secondary | ICD-10-CM | POA: Insufficient documentation

## 2015-03-10 DIAGNOSIS — Z9889 Other specified postprocedural states: Secondary | ICD-10-CM | POA: Insufficient documentation

## 2015-04-26 DIAGNOSIS — M5410 Radiculopathy, site unspecified: Secondary | ICD-10-CM | POA: Insufficient documentation

## 2015-12-22 DIAGNOSIS — A6000 Herpesviral infection of urogenital system, unspecified: Secondary | ICD-10-CM | POA: Insufficient documentation

## 2015-12-22 DIAGNOSIS — E669 Obesity, unspecified: Secondary | ICD-10-CM | POA: Insufficient documentation

## 2015-12-22 DIAGNOSIS — E785 Hyperlipidemia, unspecified: Secondary | ICD-10-CM | POA: Insufficient documentation

## 2016-01-06 ENCOUNTER — Encounter (HOSPITAL_COMMUNITY): Payer: Self-pay | Admitting: Emergency Medicine

## 2016-01-06 ENCOUNTER — Emergency Department (HOSPITAL_COMMUNITY): Payer: Medicare Other

## 2016-01-06 ENCOUNTER — Emergency Department (HOSPITAL_COMMUNITY)
Admission: EM | Admit: 2016-01-06 | Discharge: 2016-01-06 | Disposition: A | Discharge status: Home / Self Care | Payer: Medicare Other | Attending: Emergency Medicine | Admitting: Emergency Medicine

## 2016-01-06 ENCOUNTER — Other Ambulatory Visit: Payer: Self-pay

## 2016-01-06 DIAGNOSIS — Z87891 Personal history of nicotine dependence: Secondary | ICD-10-CM | POA: Insufficient documentation

## 2016-01-06 DIAGNOSIS — E785 Hyperlipidemia, unspecified: Secondary | ICD-10-CM | POA: Diagnosis not present

## 2016-01-06 DIAGNOSIS — I10 Essential (primary) hypertension: Secondary | ICD-10-CM | POA: Insufficient documentation

## 2016-01-06 DIAGNOSIS — R079 Chest pain, unspecified: Secondary | ICD-10-CM | POA: Diagnosis present

## 2016-01-06 DIAGNOSIS — Z7982 Long term (current) use of aspirin: Secondary | ICD-10-CM | POA: Insufficient documentation

## 2016-01-06 DIAGNOSIS — Z96653 Presence of artificial knee joint, bilateral: Secondary | ICD-10-CM | POA: Insufficient documentation

## 2016-01-06 DIAGNOSIS — Z79899 Other long term (current) drug therapy: Secondary | ICD-10-CM | POA: Diagnosis not present

## 2016-01-06 LAB — BASIC METABOLIC PANEL
ANION GAP: 9 (ref 5–15)
BUN: 11 mg/dL (ref 6–20)
CALCIUM: 8.7 mg/dL — AB (ref 8.9–10.3)
CO2: 26 mmol/L (ref 22–32)
Chloride: 104 mmol/L (ref 101–111)
Creatinine, Ser: 1.06 mg/dL (ref 0.61–1.24)
GFR calc Af Amer: >60 mL/min (ref >60)
GFR calc non Af Amer: >60 mL/min (ref >60)
GLUCOSE: 94 mg/dL (ref 65–99)
POTASSIUM: 3.2 mmol/L — AB (ref 3.5–5.1)
Sodium: 139 mmol/L (ref 135–145)

## 2016-01-06 LAB — CBC
HEMATOCRIT: 40.6 % (ref 39.0–52.0)
HEMOGLOBIN: 13.6 g/dL (ref 13.0–17.0)
MCH: 29.3 pg (ref 26.0–34.0)
MCHC: 33.5 g/dL (ref 30.0–36.0)
MCV: 87.5 fL (ref 78.0–100.0)
Platelets: 211 10*3/uL (ref 150–400)
RBC: 4.64 MIL/uL (ref 4.22–5.81)
RDW: 12.7 % (ref 11.5–15.5)
WBC: 8.9 10*3/uL (ref 4.0–10.5)

## 2016-01-06 LAB — I-STAT TROPONIN, ED: Troponin i, poc: 0 ng/mL (ref 0.00–0.08)

## 2016-01-06 MED ORDER — FAMOTIDINE 20 MG PO TABS: 20.0000 mg | ORAL_TABLET | Freq: Two times a day (BID) | ORAL | Status: AC

## 2016-01-06 MED ORDER — ASPIRIN 81 MG PO CHEW
324.0000 mg | CHEWABLE_TABLET | Freq: Once | ORAL | Status: AC
  Administered 2016-01-06: 324 mg via ORAL
  Filled 2016-01-06: qty 4

## 2016-01-06 MED ORDER — FAMOTIDINE 20 MG PO TABS
20.0000 mg | ORAL_TABLET | Freq: Once | ORAL | Status: AC
  Administered 2016-01-06: 20 mg via ORAL
  Filled 2016-01-06: qty 1

## 2016-01-06 MED ORDER — ASPIRIN 81 MG PO CHEW: 81.0000 mg | CHEWABLE_TABLET | Freq: Every day | ORAL | Status: AC

## 2016-01-06 NOTE — ED Provider Notes (Signed)
CSN: [id number]     Arrival date & time 01/06/16  1542 History   First MD Initiated Contact with Patient 01/06/16 1949     Chief Complaint  Patient presents with  . Chest Pain     (Consider location/radiation/quality/duration/timing/severity/associated sxs/prior Treatment) HPI Patient reports he was at a funeral in [place] yesterday afternoon. He reports he was driving back to [place] today and started noticing an uncomfortable, painful area in his right upper chest. He reports a kind of a sharp pain. It was exacerbated by moving his head and shoulder certain way. He reports it moved from being sharp to kind of dull. He reports yesterday it was uncomfortable. Pushed around his collar bone on the right. Today he reports is not that tender to pressure. No associated shortness of breath, nausea, diaphoresis. No radiation. No other associated symptoms. Patient reports he used to get pains like this when he was younger and he didn't worry about it. He reports now as he has gotten older and more people getting medical problems, he is starting to worry more about some symptoms. Past Medical History  Diagnosis Date  . Genital herpes   . Hypertension   . Hyperlipidemia   . Headache(784.0)   . Arthritis     both knees   Past Surgical History  Procedure Laterality Date  . Joint replacement  2011    left knee  . Joint replacement  2009    right knee  . Fracture surgery  1974    right  foot surgery  . Hemorrhoid surgery  06/07/2011    Procedure: HEMORRHOIDECTOMY;  Surgeon: Ernestene Mention, MD;  Location: [facility];  Service: General;  Laterality: N/A;   Family History  Problem Relation Age of Onset  . Stroke Mother   . Hypertension Sister   . Hypertension Brother    Social History  Substance Use Topics  . Smoking status: Former Smoker -- 0.50 packs/day for 5 years    Types: Cigarettes    Quit date: 05/10/1972  . Smokeless tobacco: Never Used  . Alcohol Use: No    Review of  Systems  10 Systems reviewed and are negative for acute change except as noted in the HPI.   Allergies  Penicillins  Home Medications   Prior to Admission medications   Medication Sig Start Date End Date Taking? Authorizing Provider  acyclovir (ZOVIRAX) 400 MG tablet Take 400 mg by mouth 2 (two) times daily.    Yes Historical Provider, MD  amLODipine (NORVASC) 10 MG tablet Take 10 mg by mouth daily.   Yes Historical Provider, MD  Aspirin-Salicylamide-Caffeine (BC FAST PAIN RELIEF) 650-195-33.3 MG PACK Take 1 Package by mouth daily as needed (for headaches).   Yes Historical Provider, MD  atorvastatin (LIPITOR) 20 MG tablet Take 20 mg by mouth every morning.  05/04/11  Yes Historical Provider, MD  benazepril-hydrochlorthiazide (LOTENSIN HCT) 20-25 MG tablet Take 1 tablet by mouth daily.   Yes Historical Provider, MD  sildenafil (VIAGRA) 100 MG tablet Take 50-100 mg by mouth daily as needed. 12/22/15  Yes Historical Provider, MD  Tamsulosin HCl (FLOMAX) 0.4 MG CAPS Take 0.4 mg by mouth daily after supper.   Yes Historical Provider, MD  aspirin 81 MG chewable tablet Chew 1 tablet (81 mg total) by mouth daily. 01/06/16   Arby Barrette, MD  famotidine (PEPCID) 20 MG tablet Take 1 tablet (20 mg total) by mouth 2 (two) times daily. 01/06/16   Arby Barrette, MD   BP 138/83 mmHg  Pulse 70  Resp 15  Ht 5\' 7"  (1.702 m)  Wt 215 lb (97.523 kg)  BMI 33.67 kg/m2  SpO2 95% Physical Exam  Constitutional: He is oriented to person, place, and time. He appears well-developed and well-nourished.  HENT:  Head: Normocephalic and atraumatic.  Eyes: EOM are normal. Pupils are equal, round, and reactive to light.  Neck: Neck supple.  Cardiovascular: Normal rate, regular rhythm, normal heart sounds and intact distal pulses.   Pulmonary/Chest: Effort normal and breath sounds normal. He exhibits tenderness.  Right upper chest wall tenderness at sternocostal margin and clavicular insertion.  Abdominal: Soft.  Bowel sounds are normal. He exhibits no distension. There is no tenderness.  Musculoskeletal: Normal range of motion. He exhibits no edema or tenderness.  Neurological: He is alert and oriented to person, place, and time. He has normal strength. No cranial nerve deficit. He exhibits normal muscle tone. Coordination normal. GCS eye subscore is 4. GCS verbal subscore is 5. GCS motor subscore is 6.  Skin: Skin is warm, dry and intact.  Psychiatric: He has a normal mood and affect.    ED Course  Procedures (including critical care time) Labs Review Labs Reviewed  BASIC METABOLIC PANEL - Abnormal; Notable for the following:    Potassium 3.2 (*)    Calcium 8.7 (*)    All other components within normal limits  CBC  I-STAT TROPOININ, ED    Imaging Review Dg Chest 2 View  01/06/2016  CLINICAL DATA:  Stabbing pains in the right chest since last night. History of hypertension. EXAM: CHEST  2 VIEW COMPARISON:  07/08/2010. FINDINGS: The heart size and mediastinal contours are normal. The lungs are clear. There is no pleural effusion or pneumothorax. No acute osseous findings are identified. EKG snap overlies the right chest. Mild thoracic spine degenerative changes are noted. IMPRESSION: Stable chest.  No active cardiopulmonary process. Electronically Signed   By: Carey BullocksWilliam  Veazey M.D.   On: 01/06/2016 16:45   I have personally reviewed and evaluated these images and lab results as part of my medical decision-making.   EKG Interpretation   Date/Time:  Wednesday January 06 2016 15:47:14 EDT Ventricular Rate:  84 PR Interval:  188 QRS Duration: 96 QT Interval:  374 QTC Calculation: 441 R Axis:   -52 Text Interpretation:  Sinus rhythm with occasional Premature ventricular  complexes Left anterior fascicular block Moderate voltage criteria for  LVH, may be normal variant Possible Lateral infarct , age undetermined  Abnormal ECG Confirmed by Donnald GarrePfeiffer, MD, Lebron ConnersMarcy [phone]([id number]) on 01/06/2016 8:15:15  PM       MDM   Final diagnoses:  Chest pain, unspecified chest pain type   Patient has had a right upper chest pain since yesterday. He does not have associated symptoms. By history and exam, this is atypical for ischemic symptoms. Troponin is normal and EKG does not show ischemic changes. Patient has normal vital signs without hypertension. Patient will contact his family doctor this week to discuss scheduling outpatient cardiac stress testing.    Arby BarretteMarcy Trevor Wilkie, MD 01/06/16 2117

## 2016-01-06 NOTE — ED Notes (Signed)
Pt states yesterday afternoon while carrying a casket at a funeral he started having a right sided sharp chest pains that have been constant.

## 2016-01-06 NOTE — Discharge Instructions (Signed)
Nonspecific Chest Pain  °Chest pain can be caused by many different conditions. There is always a chance that your pain could be related to something serious, such as a heart attack or a blood clot in your lungs. Chest pain can also be caused by conditions that are not life-threatening. If you have chest pain, it is very important to follow up with your health care provider. °CAUSES  °Chest pain can be caused by: °· Heartburn. °· Pneumonia or bronchitis. °· Anxiety or stress. °· Inflammation around your heart (pericarditis) or lung (pleuritis or pleurisy). °· A blood clot in your lung. °· A collapsed lung (pneumothorax). It can develop suddenly on its own (spontaneous pneumothorax) or from trauma to the chest. °· Shingles infection (varicella-zoster virus). °· Heart attack. °· Damage to the bones, muscles, and cartilage that make up your chest wall. This can include: °¨ Bruised bones due to injury. °¨ Strained muscles or cartilage due to frequent or repeated coughing or overwork. °¨ Fracture to one or more ribs. °¨ Sore cartilage due to inflammation (costochondritis). °RISK FACTORS  °Risk factors for chest pain may include: °· Activities that increase your risk for trauma or injury to your chest. °· Respiratory infections or conditions that cause frequent coughing. °· Medical conditions or overeating that can cause heartburn. °· Heart disease or family history of heart disease. °· Conditions or health behaviors that increase your risk of developing a blood clot. °· Having had chicken pox (varicella zoster). °SIGNS AND SYMPTOMS °Chest pain can feel like: °· Burning or tingling on the surface of your chest or deep in your chest. °· Crushing, pressure, aching, or squeezing pain. °· Dull or sharp pain that is worse when you move, cough, or take a deep breath. °· Pain that is also felt in your back, neck, shoulder, or arm, or pain that spreads to any of these areas. °Your chest pain may come and go, or it may stay  constant. °DIAGNOSIS °Lab tests or other studies may be needed to find the cause of your pain. Your health care provider may have you take a test called an ambulatory ECG (electrocardiogram). An ECG records your heartbeat patterns at the time the test is performed. You may also have other tests, such as: °· Transthoracic echocardiogram (TTE). During echocardiography, sound waves are used to create a picture of all of the heart structures and to look at how blood flows through your heart. °· Transesophageal echocardiogram (TEE). This is a more advanced imaging test that obtains images from inside your body. It allows your health care provider to see your heart in finer detail. °· Cardiac monitoring. This allows your health care provider to monitor your heart rate and rhythm in real time. °· Holter monitor. This is a portable device that records your heartbeat and can help to diagnose abnormal heartbeats. It allows your health care provider to track your heart activity for several days, if needed. °· Stress tests. These can be done through exercise or by taking medicine that makes your heart beat more quickly. °· Blood tests. °· Imaging tests. °TREATMENT  °Your treatment depends on what is causing your chest pain. Treatment may include: °· Medicines. These may include: °¨ Acid blockers for heartburn. °¨ Anti-inflammatory medicine. °¨ Pain medicine for inflammatory conditions. °¨ Antibiotic medicine, if an infection is present. °¨ Medicines to dissolve blood clots. °¨ Medicines to treat coronary artery disease. °· Supportive care for conditions that do not require medicines. This may include: °¨ Resting. °¨ Applying heat   or cold packs to injured areas. °¨ Limiting activities until pain decreases. °HOME CARE INSTRUCTIONS °· If you were prescribed an antibiotic medicine, finish it all even if you start to feel better. °· Avoid any activities that bring on chest pain. °· Do not use any tobacco products, including  cigarettes, chewing tobacco, or electronic cigarettes. If you need help quitting, ask your health care provider. °· Do not drink alcohol. °· Take medicines only as directed by your health care provider. °· Keep all follow-up visits as directed by your health care provider. This is important. This includes any further testing if your chest pain does not go away. °· If heartburn is the cause for your chest pain, you may be told to keep your head raised (elevated) while sleeping. This reduces the chance that acid will go from your stomach into your esophagus. °· Make lifestyle changes as directed by your health care provider. These may include: °¨ Getting regular exercise. Ask your health care provider to suggest some activities that are safe for you. °¨ Eating a heart-healthy diet. A registered dietitian can help you to learn healthy eating options. °¨ Maintaining a healthy weight. °¨ Managing diabetes, if necessary. °¨ Reducing stress. °SEEK MEDICAL CARE IF: °· Your chest pain does not go away after treatment. °· You have a rash with blisters on your chest. °· You have a fever. °SEEK IMMEDIATE MEDICAL CARE IF:  °· Your chest pain is worse. °· You have an increasing cough, or you cough up blood. °· You have severe abdominal pain. °· You have severe weakness. °· You faint. °· You have chills. °· You have sudden, unexplained chest discomfort. °· You have sudden, unexplained discomfort in your arms, back, neck, or jaw. °· You have shortness of breath at any time. °· You suddenly start to sweat, or your skin gets clammy. °· You feel nauseous or you vomit. °· You suddenly feel light-headed or dizzy. °· Your heart begins to beat quickly, or it feels like it is skipping beats. °These symptoms may represent a serious problem that is an emergency. Do not wait to see if the symptoms will go away. Get medical help right away. Call your local emergency services (911 in the [place] Do not drive yourself to the hospital. °  °This  information is not intended to replace advice given to you by your health care provider. Make sure you discuss any questions you have with your health care provider. °  °Document Released: 04/13/2005 Document Revised: 07/25/2014 Document Reviewed: 02/07/2014 °[employer] Interactive Patient Education ©2016 [employer] ° °

## 2018-03-07 DIAGNOSIS — N401 Enlarged prostate with lower urinary tract symptoms: Secondary | ICD-10-CM | POA: Insufficient documentation

## 2018-03-07 DIAGNOSIS — R972 Elevated prostate specific antigen [PSA]: Secondary | ICD-10-CM | POA: Insufficient documentation

## 2018-03-07 DIAGNOSIS — N138 Other obstructive and reflux uropathy: Secondary | ICD-10-CM | POA: Insufficient documentation

## 2019-04-25 DIAGNOSIS — E559 Vitamin D deficiency, unspecified: Secondary | ICD-10-CM | POA: Insufficient documentation

## 2019-04-25 DIAGNOSIS — M8580 Other specified disorders of bone density and structure, unspecified site: Secondary | ICD-10-CM | POA: Insufficient documentation

## 2019-04-25 DIAGNOSIS — M84454A Pathological fracture, pelvis, initial encounter for fracture: Secondary | ICD-10-CM | POA: Insufficient documentation

## 2020-11-12 DIAGNOSIS — K644 Residual hemorrhoidal skin tags: Secondary | ICD-10-CM | POA: Insufficient documentation

## 2020-11-12 DIAGNOSIS — K648 Other hemorrhoids: Secondary | ICD-10-CM | POA: Insufficient documentation

## 2020-12-21 ENCOUNTER — Other Ambulatory Visit: Payer: Self-pay

## 2020-12-21 ENCOUNTER — Ambulatory Visit (INDEPENDENT_AMBULATORY_CARE_PROVIDER_SITE_OTHER): Payer: Medicare Other | Admitting: Podiatry

## 2020-12-21 DIAGNOSIS — L600 Ingrowing nail: Secondary | ICD-10-CM

## 2020-12-21 DIAGNOSIS — M79674 Pain in right toe(s): Secondary | ICD-10-CM

## 2020-12-21 DIAGNOSIS — M79675 Pain in left toe(s): Secondary | ICD-10-CM

## 2020-12-21 DIAGNOSIS — B351 Tinea unguium: Secondary | ICD-10-CM

## 2020-12-21 NOTE — Progress Notes (Signed)
Subjective:   Patient ID: [name], male   DOB: 75 y.o.   MRN: [id number]   HPI 75 year old male presents the office today for concerns of toenail discoloration and thickening of the nails mostly on his big toenails bilaterally.  They do cause discomfort particularly when they have pressure.  Denies any redness or drainage or any swelling.   Review of Systems  All other systems reviewed and are negative.  Past Medical History:  Diagnosis Date   Arthritis    both knees   Genital herpes    Headache(784.0)    Hyperlipidemia    Hypertension     Past Surgical History:  Procedure Laterality Date   FRACTURE SURGERY  1974   right  foot surgery   HEMORRHOID SURGERY  06/07/2011   Procedure: HEMORRHOIDECTOMY;  Surgeon: Ernestene Mention, MD;  Location: [facility];  Service: General;  Laterality: N/A;   JOINT REPLACEMENT  2011   left knee   JOINT REPLACEMENT  2009   right knee     Current Outpatient Medications:    bupivacaine (MARCAINE) 0.25 % injection, Inject into the articular space., Disp: , Rfl:    dexamethasone (DECADRON) 4 MG/ML injection, Inject into the articular space., Disp: , Rfl:    gabapentin (NEURONTIN) 300 MG capsule, Take 1 capsule by mouth at bedtime., Disp: , Rfl:    meloxicam (MOBIC) 15 MG tablet, Take by mouth., Disp: , Rfl:    potassium chloride SA (KLOR-CON) 20 MEQ tablet, Take 1 tablet by mouth daily., Disp: , Rfl:    triamcinolone acetonide (KENALOG-40) 40 MG/ML injection (RADIOLOGY ONLY), Inject into the articular space., Disp: , Rfl:    acyclovir (ZOVIRAX) 400 MG tablet, Take 400 mg by mouth 2 (two) times daily. , Disp: , Rfl:    acyclovir in dextrose 5 % 250 mL, , Disp: , Rfl:    amLODipine (NORVASC) 10 MG tablet, Take 10 mg by mouth daily., Disp: , Rfl:    amLODIPine Besylate 1 MG/ML SOLN, , Disp: , Rfl:    aspirin 81 MG chewable tablet, Chew 1 tablet (81 mg total) by mouth daily., Disp: 30 tablet, Rfl: 0   Aspirin-Salicylamide-Caffeine (BC FAST PAIN  RELIEF) 650-195-33.3 MG PACK, Take 1 Package by mouth daily as needed (for headaches)., Disp: , Rfl:    atorvastatin (LIPITOR) 10 MG tablet, , Disp: , Rfl:    atorvastatin (LIPITOR) 20 MG tablet, Take 20 mg by mouth every morning. , Disp: , Rfl:    benazepril-hydrochlorthiazide (LOTENSIN HCT) 10-12.5 MG tablet, , Disp: , Rfl:    benazepril-hydrochlorthiazide (LOTENSIN HCT) 20-25 MG tablet, Take 1 tablet by mouth daily., Disp: , Rfl:    cetirizine (ZYRTEC) 10 MG tablet, Take by mouth., Disp: , Rfl:    Cetirizine HCl (ZYRTEC ALLERGY) 10 MG CAPS, , Disp: , Rfl:    Cholecalciferol (VITAMIN D3) 1.25 MG (50000 UT) CAPS, , Disp: , Rfl:    Cholecalciferol 125 MCG (5000 UT) TABS, Take by mouth., Disp: , Rfl:    diazepam (VALIUM) 5 MG tablet, SMARTSIG:1 Tablet(s) By Mouth, Disp: , Rfl:    famotidine (PEPCID) 20 MG tablet, Take 1 tablet (20 mg total) by mouth 2 (two) times daily., Disp: 30 tablet, Rfl: 0   meloxicam (MOBIC) 15 MG tablet, Take 1 tablet by mouth daily., Disp: , Rfl:    potassium chloride SA (KLOR-CON) 20 MEQ tablet, Take 20 mEq by mouth daily., Disp: , Rfl:    sildenafil (VIAGRA) 100 MG tablet, Take 50-100 mg by  mouth daily as needed., Disp: , Rfl:    Sildenafil Citrate 10 MG/12.5ML SOLN, , Disp: , Rfl:    tamsulosin (FLOMAX) 0.4 MG CAPS capsule, , Disp: , Rfl:    Tamsulosin HCl (FLOMAX) 0.4 MG CAPS, Take 0.4 mg by mouth daily after supper., Disp: , Rfl:   Allergies  Allergen Reactions   Penicillins Hives    All over the body Other reaction(s): rash         Objective:  Physical Exam  General: AAO x3, NAD  Dermatological: Bilateral hallux nails are hypertrophic, dystrophic with yellow-brown discoloration.  Mild incurvation on both medial lateral nail borders of bilateral hallux nails.  Other nails are slightly discolored as well but not as hypertrophic as the big toenails.  There is tenderness he reports the big toenails but no significant discomfort today.  No edema, erythema,  drainage or pus or any signs of infection.  Vascular: Dorsalis Pedis artery and Posterior Tibial artery pedal pulses are 2/4 bilateral with immedate capillary fill time. There is no pain with calf compression, swelling, warmth, erythema.   Neruologic: Grossly intact via light touch bilateral.   Musculoskeletal: No gross boney pedal deformities bilateral. No pain, crepitus, or limitation noted with foot and ankle range of motion bilateral. Muscular strength 5/5 in all groups tested bilateral.  Gait: Unassisted, Nonantalgic.       Assessment:   Ingrown toenails, symptomatic onychomycosis    Plan:  -Treatment options discussed including all alternatives, risks, and complications -Etiology of symptoms were discussed -We discussed treatment options for nail fungus including oral, topical treatment as well as alternatives.  After discussion regards to the patient he wants to have the big toenails removed.  We discussed the procedures with postop courses as well as risks.  He wants to proceed.  We will schedule him for this as he wants to hold off on doing it today.  If he does not do this discussed with him a compound cream through [facility] to help the nail fungus as well as to help thin the toenails.  Vivi Barrack DPM

## 2021-01-19 ENCOUNTER — Ambulatory Visit (INDEPENDENT_AMBULATORY_CARE_PROVIDER_SITE_OTHER): Payer: Medicare Other | Admitting: Podiatry

## 2021-01-19 ENCOUNTER — Encounter: Payer: Self-pay | Admitting: Podiatry

## 2021-01-19 ENCOUNTER — Other Ambulatory Visit: Payer: Self-pay

## 2021-01-19 DIAGNOSIS — Z0289 Encounter for other administrative examinations: Secondary | ICD-10-CM | POA: Insufficient documentation

## 2021-01-19 DIAGNOSIS — M79675 Pain in left toe(s): Secondary | ICD-10-CM

## 2021-01-19 DIAGNOSIS — B351 Tinea unguium: Secondary | ICD-10-CM | POA: Diagnosis not present

## 2021-01-19 DIAGNOSIS — M79674 Pain in right toe(s): Secondary | ICD-10-CM

## 2021-01-19 DIAGNOSIS — L600 Ingrowing nail: Secondary | ICD-10-CM

## 2021-01-20 NOTE — Progress Notes (Signed)
Subjective: 75 year old male presents the office today for follow-up evaluation of toenail fungus, discomfort to his hallux toenails.  He has been using the topical medication for nail fungus he is not sure if there is been any improvement.  He does feel that the tenderness of the big toenails has improved.  Denies any redness or drainage or any swelling. Denies any systemic complaints such as fevers, chills, nausea, vomiting. No acute changes since last appointment, and no other complaints at this time.   Objective: AAO x3, NAD DP/PT pulses palpable bilaterally, CRT less than 3 seconds Nails are mostly unchanged and continued hypertrophic, dystrophic with discoloration to the nails.  There is some slight clear on the proximal nail fold most on the left hallux toenail.  Mild incurvation of the hallux toenails there is no significant pain today and there is no edema, erythema or signs of infection.  No open lesions or pre-ulcerative lesions.  No pain with calf compression, swelling, warmth, erythema  Assessment: Onychodystrophy/onychomycosis with ingrown toenail  Plan: -All treatment options discussed with the patient including all alternatives, risks, complications.  -Discussed nail removal of the hallux toenails after discussion wishes to hold off on this as they are feeling somewhat better.  We will continue with topical medication for now.  Monitor for any signs or symptoms of infection or increased pain.  I will see him back in 3 months or sooner if needed. -Patient encouraged to call the office with any questions, concerns, change in symptoms.   Vivi Barrack DPM

## 2021-04-27 ENCOUNTER — Ambulatory Visit: Payer: Medicare Other | Admitting: Podiatry

## 2021-09-01 ENCOUNTER — Encounter: Payer: Self-pay | Admitting: Emergency Medicine

## 2021-09-01 ENCOUNTER — Other Ambulatory Visit: Payer: Self-pay

## 2021-09-01 ENCOUNTER — Emergency Department (INDEPENDENT_AMBULATORY_CARE_PROVIDER_SITE_OTHER): Payer: Medicare Other

## 2021-09-01 ENCOUNTER — Emergency Department (INDEPENDENT_AMBULATORY_CARE_PROVIDER_SITE_OTHER)
Admission: EM | Admit: 2021-09-01 | Discharge: 2021-09-01 | Disposition: A | Discharge status: Home / Self Care | Payer: Medicare Other | Source: Home / Self Care

## 2021-09-01 DIAGNOSIS — M62838 Other muscle spasm: Secondary | ICD-10-CM

## 2021-09-01 DIAGNOSIS — M542 Cervicalgia: Secondary | ICD-10-CM | POA: Diagnosis not present

## 2021-09-01 MED ORDER — METHYLPREDNISOLONE 4 MG PO TBPK: ORAL_TABLET | ORAL | 0 refills | Status: AC

## 2021-09-01 MED ORDER — BACLOFEN 10 MG PO TABS: 10.0000 mg | ORAL_TABLET | Freq: Three times a day (TID) | ORAL | 0 refills | Status: AC

## 2021-09-01 NOTE — Discharge Instructions (Addendum)
Advised patient of cervical spine x-ray results this evening.  Advised patient to take medication as directed with food to completion.  Advised patient may use baclofen daily or as needed for accompanying trapezius muscle spasms.

## 2021-09-01 NOTE — ED Triage Notes (Signed)
Cervical neck pain x 1 week  Denies injury

## 2021-09-01 NOTE — ED Provider Notes (Signed)
[facility]    CSN: [id number] Arrival date & time: 09/01/21  1904      History   Chief Complaint Chief Complaint  Patient presents with   Neck Pain    HPI [name] is a 76 y.o. male.   HPI 76 year old male presents with neck pain for 1 week.  Patient denies injury or insult.  PMH significant for HTN and BPH with urinary obstruction.  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   Elevated PSA 03/07/2018   Herpes genitalia 12/22/2015   Hyperlipidemia 12/22/2015   Obesity (BMI 30-39.9) 12/22/2015   Radiculopathy affecting upper extremity 04/26/2015   Hx of repair of left rotator cuff 03/10/2015   Complete tear of left rotator cuff 01/28/2015   Benign essential HTN 12/24/2013   Breathing problem 12/24/2013   Herpes simplex 12/24/2013   Hyperglycemia 12/24/2013   Incomplete emptying of bladder 12/24/2013   Low back pain 12/24/2013   Neck pain 12/24/2013   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  Authorizing Provider  baclofen (LIORESAL) 10 MG tablet Take 1 tablet (10 mg total) by mouth 3 (three) times daily. 09/01/21  Yes Trevor Ihaagan, Delma Drone, FNP  methylPREDNISolone (MEDROL DOSEPAK) 4 MG TBPK tablet Take as directed. 09/01/21  Yes Trevor Ihaagan, Alona Danford, FNP  acyclovir (ZOVIRAX) 400 MG tablet Take 400 mg by mouth 2 (two) times daily.     [provider]  acyclovir in dextrose 5 % 250 mL     [provider]  amLODipine (NORVASC) 10 MG tablet Take 10 mg by mouth daily.    [provider]  amLODIPine Besylate 1 MG/ML SOLN     [provider]  aspirin 81 MG chewable tablet Chew 1 tablet (81 mg total) by mouth daily. 01/06/16   Arby BarrettePfeiffer, Marcy, MD  Aspirin-Salicylamide-Caffeine (BC FAST PAIN RELIEF) [phone] MG PACK Take 1 Package by mouth daily as needed (for headaches).    [provider]  atorvastatin (LIPITOR) 10 MG tablet     [provider]  atorvastatin (LIPITOR) 20 MG tablet Take 20 mg by mouth every morning.  05/04/11   [provider]  benazepril-hydrochlorthiazide (LOTENSIN HCT) 10-12.5 MG tablet     [provider]  benazepril-hydrochlorthiazide (LOTENSIN HCT) 20-25 MG tablet Take 1 tablet by mouth daily.    [provider]  bupivacaine (MARCAINE) 0.25 % injection Inject into the articular space. 09/21/18   [provider]  cetirizine (ZYRTEC) 10 MG tablet Take by mouth.    [provider]  Cetirizine HCl (ZYRTEC ALLERGY) 10 MG CAPS     [provider]  Cholecalciferol (VITAMIN D3) 1.25 MG (50000 UT) CAPS     [provider]  Cholecalciferol 125 MCG (5000 UT) TABS Take by mouth.    [provider]  dexamethasone (DECADRON) 4 MG/ML injection Inject into the articular space. 09/21/18   [provider]  diazepam (VALIUM) 5 MG tablet SMARTSIG:1 Tablet(s) By Mouth 12/11/20   [provider]  famotidine (PEPCID) 20 MG tablet Take 1 tablet (20 mg total) by mouth  2 (two) times daily. 01/06/16   Arby Barrette, MD  gabapentin (NEURONTIN) 300 MG capsule Take 1 capsule by mouth at bedtime. 03/20/18   [provider]  meloxicam (MOBIC) 15 MG tablet Take 1 tablet by mouth daily. 10/21/20   [provider]  meloxicam (MOBIC) 15 MG tablet Take by mouth. 05/06/20   [provider]  potassium chloride SA (KLOR-CON) 20 MEQ tablet Take 1 tablet by mouth daily. 10/28/20   [provider]  potassium chloride SA (KLOR-CON) 20 MEQ tablet Take 20 mEq by mouth daily. 10/28/20   [provider]  sildenafil (VIAGRA) 100 MG tablet Take 50-100 mg by mouth daily as needed. 12/22/15   [provider]  Sildenafil Citrate 10 MG/12.5ML SOLN     [provider]  tamsulosin (FLOMAX) 0.4 MG CAPS capsule     [provider]  Tamsulosin HCl (FLOMAX) 0.4 MG CAPS Take 0.4 mg by mouth daily after supper.    [provider]  triamcinolone acetonide (KENALOG-40) 40 MG/ML injection (RADIOLOGY ONLY) Inject into the articular space. 11/10/20   [provider]    Family History Family History  Problem Relation Age of Onset   Stroke Mother    Hypertension Sister    Hypertension Brother     Social History Social History   Tobacco Use   Smoking status: Former    Packs/day: 0.50    Years: 5.00    Pack years: 2.50    Types: Cigarettes    Quit date: 05/10/1972    Years since quitting: 49.3   Smokeless tobacco: Never  Vaping Use   Vaping Use: Never used  Substance Use Topics   Alcohol use: No   Drug use: No     Allergies   Penicillins   Review of Systems Review of Systems  Musculoskeletal:  Positive for neck pain.    Physical Exam Triage Vital Signs ED Triage Vitals  Enc Vitals Group     BP 09/01/21 1927 (!) 153/76     Pulse Rate 09/01/21 1927 68     Resp 09/01/21 1927 17     Temp --      Temp Source 09/01/21 1927 Oral     SpO2 09/01/21 1927 97 %     Weight 09/01/21 1929 214 lb 15.2  oz (97.5 kg)     Height 09/01/21 1929 5\' 7"  (1.702 m)     Head Circumference --      Peak Flow --      Pain Score 09/01/21 1929 4     Pain Loc --      Pain Edu? --      Excl. in GC? --    No data found.  Updated Vital Signs BP (!) 153/76 (BP Location: Left Arm)    Pulse 68    Resp 17    Ht 5'  7" (1.702 m)    Wt 214 lb 15.2 oz (97.5 kg)    SpO2 97%    BMI 33.67 kg/m    Physical Exam Vitals and nursing note reviewed.  Constitutional:      General: He is not in acute distress.    Appearance: Normal appearance. He is obese. He is not ill-appearing.  HENT:     Head: Normocephalic and atraumatic.     Mouth/Throat:     Mouth: Mucous membranes are moist.     Pharynx: Oropharynx is clear.  Eyes:     Extraocular Movements: Extraocular movements intact.     Conjunctiva/sclera: Conjunctivae normal.     Pupils: Pupils are equal, round, and reactive to light.  Cardiovascular:     Rate and Rhythm: Normal rate and regular rhythm.     Pulses: Normal pulses.     Heart sounds: Normal heart sounds.  Pulmonary:     Effort: Pulmonary effort is normal.     Breath sounds: Normal breath sounds.  Musculoskeletal:     Cervical back: Normal range of motion and neck supple. No tenderness.     Comments: Palpable bilateral trapezius muscle spasms/adhesions noted  Lymphadenopathy:     Cervical: No cervical adenopathy.  Skin:    General: Skin is warm and dry.  Neurological:     General: No focal deficit present.     Mental Status: He is alert and oriented to person, place, and time.     UC Treatments / Results  Labs (all labs ordered are listed, but only abnormal results are displayed) Labs Reviewed - No data to display  EKG   Radiology DG Cervical Spine Complete  Result Date: 09/01/2021 CLINICAL DATA:  Right-sided neck pain. EXAM: CERVICAL SPINE - COMPLETE 4+ VIEW COMPARISON:  None. FINDINGS: There is no acute fracture or subluxation of the cervical spine. There is multilevel  degenerative changes with disc space narrowing and endplate irregularity and spurring. The visualized posterior elements and odontoid are intact. There is anatomic alignment of the lateral masses of C1 and C2. The soft tissues are unremarkable. IMPRESSION: 1. No acute fracture or subluxation. 2. Multilevel degenerative changes. Electronically Signed   By: Elgie Collard M.D.   On: 09/01/2021 19:36    Procedures Procedures (including critical care time)  Medications Ordered in UC Medications - No data to display  Initial Impression / Assessment and Plan / UC Course  I have reviewed the triage vital signs and the nursing notes.  Pertinent labs & imaging results that were available during my care of the patient were reviewed by me and considered in my medical decision making (see chart for details).     MDM: 1.  Neck pain-Rx'd Medrol Dosepak; 2.  Trapezius muscle spasm-Rx'd Baclofen. Advised patient of cervical spine x-ray results this evening.  Advised patient to take medication as directed with food to completion.  Advised patient may use baclofen daily or as needed for accompanying trapezius muscle spasms.  Patient discharged home, hemodynamically stable. Final Clinical Impressions(s) / UC Diagnoses   Final diagnoses:  Neck pain  Trapezius muscle spasm     Discharge Instructions      Advised patient of cervical spine x-ray results this evening.  Advised patient to take medication as directed with food to completion.  Advised patient may use baclofen daily or as needed for accompanying trapezius muscle spasms.     ED Prescriptions     Medication Sig Dispense Auth. Provider   methylPREDNISolone (MEDROL DOSEPAK) 4  MG TBPK tablet Take as directed. 1 each Trevor Iha, FNP   baclofen (LIORESAL) 10 MG tablet Take 1 tablet (10 mg total) by mouth 3 (three) times daily. 30 each Trevor Iha, FNP      PDMP not reviewed this encounter.   Trevor Iha, FNP 09/01/21 2006

## 2021-09-26 ENCOUNTER — Emergency Department (INDEPENDENT_AMBULATORY_CARE_PROVIDER_SITE_OTHER)
Admission: EM | Admit: 2021-09-26 | Discharge: 2021-09-26 | Disposition: A | Discharge status: Home / Self Care | Payer: Medicare Other | Source: Home / Self Care

## 2021-09-26 ENCOUNTER — Other Ambulatory Visit: Payer: Self-pay

## 2021-09-26 DIAGNOSIS — M47812 Spondylosis without myelopathy or radiculopathy, cervical region: Secondary | ICD-10-CM | POA: Diagnosis not present

## 2021-09-26 MED ORDER — CELECOXIB 100 MG PO CAPS: 100.0000 mg | ORAL_CAPSULE | Freq: Two times a day (BID) | ORAL | 0 refills | Status: AC

## 2021-09-26 NOTE — ED Provider Notes (Signed)
[name]    CSN: [id number] Arrival date & time: 09/26/21  1114      History   Chief Complaint Chief Complaint  Patient presents with   Neck Pain    HPI [name] is a 76 y.o. male.   HPI Pleasant 76 year old male presents with left posterior neck pain since yesterday.  Reports prior incident of neck pain recently in which she was told he had degenerative disc disease and needs physical therapy; however, physical therapy has not followed up with him to schedule appointment.  Patient was evaluated by me for cervicalgia 09/01/21.  X-ray of cervical spine revealed multilevel degenerative changes of disc space narrowing and endplate irregularity and spurring.  Patient was treated with Medrol Dosepak and Baclofen.  Patient was evaluated by orthopedic provider at [facility] spine specialists on 09/06/2021 for cervical facet syndrome and received Kenalog and Norflex injection at that office visit.  Patient was referred to physical therapy by this provider.  PMH significant for neck pain, HTN, and obesity.  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   Elevated PSA 03/07/2018   Herpes genitalia 12/22/2015   Hyperlipidemia 12/22/2015   Obesity (BMI 30-39.9) 12/22/2015   Radiculopathy affecting upper extremity 04/26/2015   Hx of repair of left rotator cuff 03/10/2015   Complete tear of left rotator cuff 01/28/2015   Benign essential HTN 12/24/2013   Breathing problem 12/24/2013   Herpes simplex 12/24/2013   Hyperglycemia 12/24/2013    Incomplete emptying of bladder 12/24/2013   Low back pain 12/24/2013   Neck pain 12/24/2013   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 Authorizing Provider  celecoxib (CELEBREX) 100 MG capsule Take 1 capsule (100 mg total) by mouth 2 (two) times daily for 15 days. 09/26/21 10/11/21 Yes Trevor Iha, FNP  acyclovir (ZOVIRAX) 400 MG tablet Take 400 mg by mouth 2 (two) times daily.     [provider]  acyclovir in dextrose 5 % 250 mL     [provider]  amLODipine (NORVASC) 10 MG tablet Take 10 mg by mouth daily.    [provider]  amLODIPine Besylate 1 MG/ML SOLN     [provider]  aspirin 81 MG chewable tablet Chew 1 tablet (81 mg total) by mouth daily. 01/06/16   Arby Barrette, MD  Aspirin-Salicylamide-Caffeine (BC FAST PAIN RELIEF) [phone] MG PACK Take 1 Package by mouth daily as needed (for headaches).  [provider]  atorvastatin (LIPITOR) 10 MG tablet     [provider]  atorvastatin (LIPITOR) 20 MG tablet Take 20 mg by mouth every morning.  05/04/11   [provider]  baclofen (LIORESAL) 10 MG tablet Take 1 tablet (10 mg total) by mouth 3 (three) times daily. 09/01/21   Trevor Ihaagan, Wonda Goodgame, FNP  benazepril-hydrochlorthiazide (LOTENSIN HCT) 10-12.5 MG tablet     [provider]  benazepril-hydrochlorthiazide (LOTENSIN HCT) 20-25 MG tablet Take 1 tablet by mouth daily.     [provider]  bupivacaine (MARCAINE) 0.25 % injection Inject into the articular space. 09/21/18   [provider]  cetirizine (ZYRTEC) 10 MG tablet Take by mouth.    [provider]  Cetirizine HCl (ZYRTEC ALLERGY) 10 MG CAPS     [provider]  Cholecalciferol (VITAMIN D3) 1.25 MG (50000 UT) CAPS     [provider]  Cholecalciferol 125 MCG (5000 UT) TABS Take by mouth.    [provider]  dexamethasone (DECADRON) 4 MG/ML injection Inject into the articular space. 09/21/18   [provider]  diazepam (VALIUM) 5 MG tablet SMARTSIG:1 Tablet(s) By Mouth 12/11/20   [provider]  famotidine (PEPCID) 20 MG tablet Take 1 tablet (20 mg total) by mouth 2 (two) times daily. 01/06/16   Arby BarrettePfeiffer, Marcy, MD  gabapentin (NEURONTIN) 300 MG capsule Take 1 capsule by mouth at bedtime. 03/20/18   [provider]  meloxicam (MOBIC) 15 MG tablet Take 1 tablet by mouth daily. 10/21/20   [provider]  meloxicam (MOBIC) 15 MG tablet Take by mouth. 05/06/20   [provider]  methylPREDNISolone (MEDROL DOSEPAK) 4 MG TBPK tablet Take as directed. 09/01/21   Trevor Ihaagan, Shian Goodnow, FNP  potassium chloride SA (KLOR-CON) 20 MEQ tablet Take 1 tablet by mouth daily. 10/28/20   [provider]  potassium chloride SA (KLOR-CON) 20 MEQ tablet Take 20 mEq by mouth daily. 10/28/20   [provider]  sildenafil (VIAGRA) 100 MG tablet Take 50-100 mg by mouth daily as needed. 12/22/15   [provider]  Sildenafil Citrate 10 MG/12.5ML SOLN     [provider]  tamsulosin (FLOMAX) 0.4 MG CAPS capsule     [provider]  Tamsulosin HCl (FLOMAX) 0.4 MG CAPS Take 0.4 mg by mouth daily after supper.    [provider]  triamcinolone acetonide (KENALOG-40) 40 MG/ML injection (RADIOLOGY ONLY) Inject into the articular space. 11/10/20   [provider]    Family History Family History   Problem Relation Age of Onset   Stroke Mother    Hypertension Father    Hypertension Sister    Hypertension Brother     Social History Social History   Tobacco Use   Smoking status: Former    Packs/day: 0.50    Years: 5.00    Pack years: 2.50    Types: Cigarettes    Quit date: 05/10/1972    Years since quitting: 49.4   Smokeless tobacco: Never  Vaping Use   Vaping Use: Never used  Substance Use Topics   Alcohol use: No   Drug use: No     Allergies   Penicillins   Review of Systems Review of Systems  Musculoskeletal:  Positive for neck pain.  All other systems reviewed and are negative.   Physical Exam Triage Vital Signs ED Triage Vitals  Enc Vitals Group     BP 09/26/21 1130 127/84     Pulse Rate 09/26/21 1130  80     Resp 09/26/21 1130 20     Temp 09/26/21 1130 98.1 F (36.7 C)     Temp Source 09/26/21 1130 Oral     SpO2 09/26/21 1130 98 %     Weight 09/26/21 1129 216 lb (98 kg)     Height 09/26/21 1129  (1.727 m)     Head Circumference --      Peak Flow --      Pain Score 09/26/21 1129 5     Pain Loc --      Pain Edu? --      Excl. in GC? --    No data found.  Updated Vital Signs BP 127/84 (BP Location: Right Arm)    Pulse 80    Temp 98.1 F (36.7 C) (Oral)    Resp 20    Ht  (1.727 m)    Wt 216 lb (98 kg)    SpO2 98%    BMI 32.84 kg/m     Physical Exam Vitals and nursing note reviewed.  Constitutional:      Appearance: Normal appearance. He is obese.  HENT:     Head: Normocephalic and atraumatic.     Mouth/Throat:     Mouth: Mucous membranes are moist.     Pharynx: Oropharynx is clear.  Eyes:     Extraocular Movements: Extraocular movements intact.     Conjunctiva/sclera: Conjunctivae normal.     Pupils: Pupils are equal, round, and reactive to light.  Neck:     Comments: Limited range of motion with 4 planes of movement Cardiovascular:     Pulses: Normal pulses.     Heart sounds: Normal heart sounds.  Pulmonary:      Effort: Pulmonary effort is normal.     Breath sounds: Normal breath sounds.  Musculoskeletal:     Cervical back: Neck supple. No rigidity.  Skin:    General: Skin is warm and dry.  Neurological:     General: No focal deficit present.     Mental Status: He is alert and oriented to person, place, and time.     UC Treatments / Results  Labs (all labs ordered are listed, but only abnormal results are displayed) Labs Reviewed - No data to display  EKG   Radiology No results found.  Procedures Procedures (including critical care time)  Medications Ordered in UC Medications - No data to display  Initial Impression / Assessment and Plan / UC Course  I have reviewed the triage vital signs and the nursing notes.  Pertinent labs & imaging results that were available during my care of the patient were reviewed by me and considered in my medical decision making (see chart for details).     MDM: 1.  Cervical facet syndrome-Rx'd Celebrex. Advised/encouraged patient to follow-up with [facility] orthopedic spine specialist whom evaluated him on 09/06/2021 for further evaluation of ongoing neck pain and the need for possible advanced imaging (CT of cervical spine with contrast) and to determine when physical therapy can actually start for him.  Advised patient if symptoms worsen and/or unresolved please follow-up with your orthopedic spine specialist or here for further evaluation.  Work note provided per patient request prior to discharge.  Patient discharged home, hemodynamically stable. Final Clinical Impressions(s) / UC Diagnoses   Final diagnoses:  Cervical facet syndrome     Discharge Instructions      Instructed patient to discontinue Mobic and start Celebrex today.  Advised patient to take  as directed with food to completion.  Advised/encouraged patient to follow-up with [facility] orthopedic spine specialist whom evaluated him on 09/06/2021 for further evaluation of ongoing  neck pain and the need for possible advanced imaging (CT of cervical spine with contrast) and to determine when physical therapy can actually start for him.  Advised patient if symptoms worsen and/or unresolved please follow-up with your orthopedic spine specialist or here for further evaluation.     ED Prescriptions     Medication Sig Dispense Auth. Provider   celecoxib (CELEBREX) 100 MG capsule Take 1 capsule (100 mg total) by mouth 2 (two) times daily for 15 days. 30 capsule Trevor Iha, FNP      PDMP not reviewed this encounter.   Trevor Iha, FNP 09/26/21 1234

## 2021-09-26 NOTE — Discharge Instructions (Addendum)
Instructed patient to discontinue Mobic and start Celebrex today.  Advised patient to take as directed with food to completion.  Advised/encouraged patient to follow-up with [facility] orthopedic spine specialist whom evaluated him on 09/06/2021 for further evaluation of ongoing neck pain and the need for possible advanced imaging (CT of cervical spine with contrast) and to determine when physical therapy can actually start for him.  Advised patient if symptoms worsen and/or unresolved please follow-up with your orthopedic spine specialist or here for further evaluation. ?

## 2021-09-26 NOTE — ED Triage Notes (Signed)
Pt presents to Urgent Care with c/o L posterior neck pain since yesterday. Reports prior incident of neck pain recently--was told he has degenerative disc disease and needs PT, but PT hasn't f/u with pt to schedule appt.  ?

## 2022-03-24 ENCOUNTER — Ambulatory Visit (INDEPENDENT_AMBULATORY_CARE_PROVIDER_SITE_OTHER): Payer: Medicare Other | Admitting: Podiatry

## 2022-03-24 DIAGNOSIS — B351 Tinea unguium: Secondary | ICD-10-CM

## 2022-03-24 MED ORDER — CICLOPIROX 8 % EX SOLN: Freq: Every day | CUTANEOUS | 2 refills | Status: AC

## 2022-03-24 NOTE — Progress Notes (Unsigned)
Wants to know if laser is covered by [facility]  [place] Discussed remoal

## 2023-02-21 ENCOUNTER — Ambulatory Visit (INDEPENDENT_AMBULATORY_CARE_PROVIDER_SITE_OTHER): Payer: Medicare Other

## 2023-02-21 ENCOUNTER — Ambulatory Visit: Payer: Medicare Other | Admitting: Podiatry

## 2023-02-21 DIAGNOSIS — M79675 Pain in left toe(s): Secondary | ICD-10-CM

## 2023-02-21 DIAGNOSIS — M79674 Pain in right toe(s): Secondary | ICD-10-CM | POA: Diagnosis not present

## 2023-02-21 DIAGNOSIS — M7751 Other enthesopathy of right foot: Secondary | ICD-10-CM

## 2023-02-21 DIAGNOSIS — B351 Tinea unguium: Secondary | ICD-10-CM

## 2023-02-21 MED ORDER — CICLOPIROX 8 % EX SOLN: Freq: Every day | CUTANEOUS | 2 refills | Status: AC

## 2023-02-21 NOTE — Patient Instructions (Signed)
I would go to [employer] to look at shoes

## 2023-02-25 NOTE — Progress Notes (Signed)
Subjective: No chief complaint on file.   77 year old male presents the office today for follow-up evaluation of toenail fungus, discomfort to his hallux toenails.  No open lesions.  No other concerns.  No recent injury or changes.    He does get intermittent ankle pain on the right side.  This has been a chronic issue.  No recent injury.  Objective: AAO x3, NAD DP/PT pulses palpable bilaterally, CRT less than 3 seconds Nails are mostly unchanged and continued hypertrophic, dystrophic with discoloration to the nails.  There is still mild incurvation of the hallux toenails there is no significant pain today and there is no edema, erythema or signs of infection on the area of ingrown toenail.  He does get tenderness nails 1-5 bilaterally are thickened elongated.  No open lesions or pre-ulcerative lesions.  Majority tenderness is localized to the lateral aspect of the right ankle and sinus tarsi.  Trace edema.  There is no erythema or warmth.  Not able to appreciate any area of pinpoint tenderness. Decreased calcaneal inclination angle noted. No pain with calf compression, swelling, warmth, erythema  Assessment: Onychodystrophy/onychomycosis with ingrown toenail; capsulitis right ankle  Plan: -All treatment options discussed with the patient including all alternatives, risks, complications.  -X-rays obtained reviewed of the left ankle.  3 views of the foot were obtained.  There is some mild narrowing along the sinus tarsi.  Midfoot spurring.  Inferior calcaneal spur present. -Sharply debrided nails x 10 without any complications or bleeding.  Continue topical medication.  Discussed options if she would like. -Discussed [employer] to look at shoes and if needed consider orthotics.  Offered steroid injection of the sinus tarsi as well. -Daily foot inspection  No follow-ups on file.  [name] DPM

## 2023-06-23 ENCOUNTER — Ambulatory Visit (INDEPENDENT_AMBULATORY_CARE_PROVIDER_SITE_OTHER): Payer: Medicare Other | Admitting: Podiatry

## 2023-06-23 ENCOUNTER — Encounter: Payer: Self-pay | Admitting: Podiatry

## 2023-06-23 DIAGNOSIS — M778 Other enthesopathies, not elsewhere classified: Secondary | ICD-10-CM

## 2023-06-23 NOTE — Patient Instructions (Signed)

## 2023-06-23 NOTE — Progress Notes (Unsigned)
Subjective: Chief Complaint  Patient presents with   Plantar Fasciitis    Patient states that he has pain in his right foot , when he walks it hurts bad   77 year old male presents the office the above concerns.  Patient is concerned that his bone spurs on the heel.  He states that he was having pain about a week or so ago but currently not experiencing pain.  He points to the top of the foot where he gets discomfort more to the lateral aspect.  No recent injury or changes otherwise since I saw him last.   Objective: AAO x3, NAD DP/PT pulses palpable bilaterally, CRT less than 3 seconds Unable to appreciate any area pinpoint tenderness.  He has no pain on the course or insertion of plantar fascia there is no pain in the calcaneus posteriorly and inferiorly.  There is no pain upon compression of the calcaneus.  Majority tenderness that he reports is more on the dorsal aspect of the foot dorsal lateral aspect.  Again there is no tenderness on exam today.  There is no erythema or warmth.  There is no open lesions.  Decreased in the arch. No pain with calf compression, swelling, warmth, erythema  Assessment: Capsulitis right foot  Plan: -All treatment options discussed with the patient including all alternatives, risks, complications.  -I again reviewed the x-rays with the patient.  Agrees with the spurs or plantar fasciitis.  He is currently not experiencing pain but if symptoms recur discussed steroid injection.  I dispensed a pair of power steps to help support the arch.  Consider custom orthotics as well.  Symptoms persist recommend MRI. -Patient encouraged to call the office with any questions, concerns, change in symptoms.   Vivi Barrack DPM

## 2024-01-30 ENCOUNTER — Encounter: Payer: Self-pay | Admitting: Emergency Medicine

## 2024-01-30 ENCOUNTER — Ambulatory Visit: Admission: EM | Admit: 2024-01-30 | Discharge: 2024-01-30 | Disposition: A | Discharge status: Home / Self Care

## 2024-01-30 DIAGNOSIS — T63441A Toxic effect of venom of bees, accidental (unintentional), initial encounter: Secondary | ICD-10-CM | POA: Diagnosis not present

## 2024-01-30 MED ORDER — FEXOFENADINE HCL 180 MG PO TABS: 180.0000 mg | ORAL_TABLET | Freq: Every day | ORAL | 1 refills | Status: AC

## 2024-01-30 MED ORDER — TRIAMCINOLONE ACETONIDE 0.1 % EX OINT: 1.0000 | TOPICAL_OINTMENT | Freq: Two times a day (BID) | CUTANEOUS | 0 refills | Status: DC

## 2024-01-30 NOTE — Discharge Instructions (Signed)
  1. Bee sting reaction, accidental or unintentional, initial encounter (Primary) - triamcinolone  ointment (KENALOG ) 0.1 %; Apply 1 Application topically 2 (two) times daily to affected skin to help with swelling and pain. - fexofenadine  (ALLEGRA ) 180 MG tablet; Take 1 tablet (180 mg total) by mouth daily for the next 7 to 10 days to help suppress histamine reaction - Apply ice 2-3 times a day to the area of discomfort for inflammation and pain. -Continue to monitor symptoms for any change in severity if there is any escalation of current symptoms or development of new symptoms follow-up in ER for further evaluation and management.

## 2024-01-30 NOTE — ED Provider Notes (Signed)
 [facility]  Note:  This document was prepared using Dragon voice recognition software and may include unintentional dictation errors.  MRN: [id number] DOB: [birth year]  Subjective:   [name] is a 78 y.o. male presenting for a bee sting that occurred this morning under his right eye.  Patient reports he was working on his deck be came up and stung him.  Patient reports he put ice on the area immediately.  Patient takes daily antihistamine medication for seasonal allergies.  Patient has not used any other over-the-counter medication.  Patient reports that he was not overly concerned except for the fact that the sting was directly under his eye.  Initially felt like his eye might be swelling shut but after applying the ice the swelling subsided.  Patient denies any vision changes, swelling to the lips or tongue, difficulty breathing, difficulty swallowing, wheezing or stridor.  No current facility-administered medications for this encounter.  Current Outpatient Medications:    fexofenadine  (ALLEGRA ) 180 MG tablet, Take 1 tablet (180 mg total) by mouth daily., Disp: 60 tablet, Rfl: 1   triamcinolone  ointment (KENALOG ) 0.1 %, Apply 1 Application topically 2 (two) times daily., Disp: 30 g, Rfl: 0   acyclovir  (ZOVIRAX ) 400 MG tablet, Take 400 mg by mouth 2 (two) times daily. , Disp: , Rfl:    acyclovir  in dextrose  5 % 250 mL, , Disp: , Rfl:    amLODipine  (NORVASC ) 10 MG tablet, Take 10 mg by mouth daily., Disp: , Rfl:    amLODIPine  Besylate 1 MG/ML SOLN, , Disp: , Rfl:    aspirin  81 MG chewable tablet, Chew 1 tablet (81 mg total) by mouth daily., Disp: 30 tablet, Rfl: 0   Aspirin -Salicylamide-Caffeine (BC FAST PAIN RELIEF) 650-195-33.3 MG PACK, Take 1 Package by mouth daily as needed (for headaches)., Disp: , Rfl:    atorvastatin  (LIPITOR) 10 MG tablet, , Disp: , Rfl:    atorvastatin  (LIPITOR) 20 MG tablet, Take 20 mg by mouth every morning. , Disp: , Rfl:    baclofen   (LIORESAL ) 10 MG tablet, Take 1 tablet (10 mg total) by mouth 3 (three) times daily., Disp: 30 each, Rfl: 0   benazepril -hydrochlorthiazide (LOTENSIN  HCT) 10-12.5 MG tablet, , Disp: , Rfl:    benazepril -hydrochlorthiazide (LOTENSIN  HCT) 20-25 MG tablet, Take 1 tablet by mouth daily., Disp: , Rfl:    bupivacaine  (MARCAINE ) 0.25 % injection, Inject into the articular space., Disp: , Rfl:    cetirizine (ZYRTEC) 10 MG tablet, Take by mouth., Disp: , Rfl:    Cetirizine HCl (ZYRTEC ALLERGY) 10 MG CAPS, , Disp: , Rfl:    Cholecalciferol (VITAMIN D3) 1.25 MG (50000 UT) CAPS, , Disp: , Rfl:    Cholecalciferol 125 MCG (5000 UT) TABS, Take by mouth., Disp: , Rfl:    ciclopirox  (PENLAC ) 8 % solution, Apply topically at bedtime. Apply over nail and surrounding skin. Apply daily over previous coat. After seven (7) days, may remove with alcohol and continue cycle., Disp: 6.6 mL, Rfl: 2   ciclopirox  (PENLAC ) 8 % solution, Apply topically at bedtime. Apply over nail and surrounding skin. Apply daily over previous coat. After seven (7) days, may remove with alcohol and continue cycle., Disp: 6.6 mL, Rfl: 2   dexamethasone (DECADRON) 4 MG/ML injection, Inject into the articular space., Disp: , Rfl:    diazepam (VALIUM) 5 MG tablet, SMARTSIG:1 Tablet(s) By Mouth, Disp: , Rfl:    famotidine  (PEPCID ) 20 MG tablet, Take 1 tablet (20 mg total) by mouth 2 (two)  times daily., Disp: 30 tablet, Rfl: 0   gabapentin (NEURONTIN) 300 MG capsule, Take 1 capsule by mouth at bedtime., Disp: , Rfl:    meloxicam (MOBIC) 15 MG tablet, Take 1 tablet by mouth daily., Disp: , Rfl:    meloxicam (MOBIC) 15 MG tablet, Take by mouth., Disp: , Rfl:    methylPREDNISolone  (MEDROL  DOSEPAK) 4 MG TBPK tablet, Take as directed. (Patient not taking: Reported on 01/30/2024), Disp: 1 each, Rfl: 0   potassium chloride  SA (KLOR-CON ) 20 MEQ tablet, Take 1 tablet by mouth daily., Disp: , Rfl:    potassium chloride  SA (KLOR-CON ) 20 MEQ tablet, Take 20 mEq by  mouth daily., Disp: , Rfl:    sildenafil (VIAGRA) 100 MG tablet, Take 50-100 mg by mouth daily as needed., Disp: , Rfl:    Sildenafil Citrate 10 MG/12.5ML SOLN, , Disp: , Rfl:    tamsulosin (FLOMAX) 0.4 MG CAPS capsule, , Disp: , Rfl:    Tamsulosin HCl (FLOMAX) 0.4 MG CAPS, Take 0.4 mg by mouth daily after supper., Disp: , Rfl:    triamcinolone  acetonide (KENALOG -40) 40 MG/ML injection (RADIOLOGY ONLY), Inject into the articular space., Disp: , Rfl:    Allergies  Allergen Reactions   Penicillins Hives    All over the body Other reaction(s): rash    Past Medical History:  Diagnosis Date   Arthritis    both knees   Genital herpes    Headache(784.0)    Hyperlipidemia    Hypertension      Past Surgical History:  Procedure Laterality Date   FRACTURE SURGERY  1974   right  foot surgery   HEMORRHOID SURGERY  06/07/2011   Procedure: HEMORRHOIDECTOMY;  Surgeon: Elon CHRISTELLA Pacini, MD;  Location: [facility];  Service: General;  Laterality: N/A;   JOINT REPLACEMENT  2011   left knee   JOINT REPLACEMENT  2009   right knee    Family History  Problem Relation Age of Onset   Stroke Mother    Hypertension Father    Hypertension Sister    Hypertension Brother     Social History   Tobacco Use   Smoking status: Former    Current packs/day: 0.00    Average packs/day: 0.5 packs/day for 5.0 years (2.5 ttl pk-yrs)    Types: Cigarettes    Start date: 05/11/1967    Quit date: 05/10/1972    Years since quitting: 51.7   Smokeless tobacco: Never  Vaping Use   Vaping status: Never Used  Substance Use Topics   Alcohol use: No   Drug use: No    ROS Refer to HPI for ROS details.  Objective:    Vitals: BP (!) 144/74 (BP Location: Right Arm)   Pulse 74   Temp 97.7 F (36.5 C) (Oral)   Resp 17   SpO2 95%   Physical Exam Vitals and nursing note reviewed.  Constitutional:      General: He is not in acute distress.    Appearance: He is well-developed. He is not ill-appearing or  toxic-appearing.  HENT:     Head: Normocephalic.     Nose: Nose normal. No congestion or rhinorrhea.     Mouth/Throat:     Mouth: Mucous membranes are moist.     Pharynx: Oropharynx is clear. No posterior oropharyngeal erythema.  Eyes:     General:        Right eye: No discharge.        Left eye: No discharge.     Extraocular Movements:  Extraocular movements intact.     Conjunctiva/sclera: Conjunctivae normal.     Pupils: Pupils are equal, round, and reactive to light.  Cardiovascular:     Rate and Rhythm: Normal rate.  Pulmonary:     Effort: Pulmonary effort is normal. No respiratory distress.     Breath sounds: No stridor. No wheezing.  Skin:    General: Skin is warm and dry.     Capillary Refill: Capillary refill takes less than 2 seconds.     Findings: Erythema present.  Neurological:     General: No focal deficit present.     Mental Status: He is alert and oriented to person, place, and time.  Psychiatric:        Mood and Affect: Mood normal.        Behavior: Behavior normal.     Procedures  No results found for this or any previous visit (from the past 24 hours).  Assessment and Plan :     Discharge Instructions       1. Bee sting reaction, accidental or unintentional, initial encounter (Primary) - triamcinolone  ointment (KENALOG ) 0.1 %; Apply 1 Application topically 2 (two) times daily to affected skin to help with swelling and pain. - fexofenadine  (ALLEGRA ) 180 MG tablet; Take 1 tablet (180 mg total) by mouth daily for the next 7 to 10 days to help suppress histamine reaction - Apply ice 2-3 times a day to the area of discomfort for inflammation and pain. -Continue to monitor symptoms for any change in severity if there is any escalation of current symptoms or development of new symptoms follow-up in ER for further evaluation and management.      Offie Waide B Osie Merkin   Aime Meloche B, [place] 01/30/24 1031

## 2024-01-30 NOTE — ED Triage Notes (Addendum)
 Pt presents with bee sting below right eye that happened this morning. Area is swollen and red and painful to touch.  Denies sob. Tried putting ice on site.

## 2024-02-05 ENCOUNTER — Encounter: Payer: Self-pay | Admitting: Podiatry

## 2024-02-05 ENCOUNTER — Ambulatory Visit (INDEPENDENT_AMBULATORY_CARE_PROVIDER_SITE_OTHER): Admitting: Podiatry

## 2024-02-05 DIAGNOSIS — L603 Nail dystrophy: Secondary | ICD-10-CM

## 2024-02-05 DIAGNOSIS — L600 Ingrowing nail: Secondary | ICD-10-CM

## 2024-02-05 MED ORDER — DOXYCYCLINE HYCLATE 100 MG PO TABS: 100.0000 mg | ORAL_TABLET | Freq: Two times a day (BID) | ORAL | 0 refills | Status: AC

## 2024-02-05 NOTE — Patient Instructions (Signed)

## 2024-02-07 NOTE — Progress Notes (Signed)
 Subjective: Chief Complaint  Patient presents with   Toe Pain    Bilateral great toe pain. 0 pain. Non diabetic. Has appointment tomorrow with neurologist. Thick fungal toenail.   78 year old male presents the office today with above concerns.  He states the left big toenail lifted up and was causing discomfort.  Although he is not having pain today he did have the nail removed as this has been an ongoing issue now for some time.  Currently denies any swelling or redness or any drainage.  He states the right big toenail started to cause some discomfort along the nail border, but no drainage or purulence.  Objective: AAO x3, NAD DP/PT pulses palpable bilaterally, CRT less than 3 seconds Left hallux toenail is hypertrophic, dystrophic and there is subungual debris present.  The nail is loose distally.  There is incurvation present of the right hallux toenail, but no purulence or drainage or any signs of infection. No pain with calf compression, swelling, warmth, erythema  Assessment: 78 year old male with ingrown toenail, onychodystrophy  Plan: Left hallux ingrown toenail, onychodystrophy -At this time, he wants to proceed with total nail removal without chemical matricectomy.  We discussed chemical matricectomy but he did not want to proceed with this today.  Risks and complications were discussed with the patient for which they understand and  verbally consent to the procedure. Under sterile conditions a total of 3 mL of a mixture of 2% lidocaine  plain and 0.5% Marcaine  plain was infiltrated in a hallux block fashion.  An additional 3 mL was infiltrated to achieve anesthesia.  Once anesthetized, the skin was prepped in sterile fashion. A tourniquet was then applied. Next the left hallux nail was removed in total manage to remove all nail borders.  Once the nail was removed, the area was debrided and the underlying skin was intact. The area was irrigated and hemostasis was obtained.  A dry sterile  dressing was applied. After application of the dressing the tourniquet was removed and there is found to be an immediate capillary refill time to the digit. The patient tolerated the procedure well any complications. Post procedure instructions were discussed the patient for which he verbally understood. Follow-up in one week for nail check or sooner if any problems are to arise. Discussed signs/symptoms of worsening infection and directed to call the office immediately should any occur or go directly to the emergency room. In the meantime, encouraged to call the office with any questions, concerns, changes symptoms.  Right hallux ingrown toenail - Discussed partial nail avulsion he wants to hold off on this today - As a courtesy debrided the and complications of bleeding today. - Monitor for any signs of infection or worsening.   Onychodystrophy/onychomycosis - Previously discussed medication.  He has been using Penlac .  Donnice JONELLE Fees DPM

## 2024-02-22 ENCOUNTER — Ambulatory Visit
Admission: EM | Admit: 2024-02-22 | Discharge: 2024-02-22 | Disposition: A | Discharge status: Home / Self Care | Attending: Internal Medicine | Admitting: Internal Medicine

## 2024-02-22 ENCOUNTER — Encounter: Payer: Self-pay | Admitting: Emergency Medicine

## 2024-02-22 DIAGNOSIS — L989 Disorder of the skin and subcutaneous tissue, unspecified: Secondary | ICD-10-CM

## 2024-02-22 DIAGNOSIS — R2241 Localized swelling, mass and lump, right lower limb: Secondary | ICD-10-CM | POA: Diagnosis not present

## 2024-02-22 DIAGNOSIS — T63441A Toxic effect of venom of bees, accidental (unintentional), initial encounter: Secondary | ICD-10-CM

## 2024-02-22 MED ORDER — TRIAMCINOLONE ACETONIDE 0.1 % EX OINT: 1.0000 | TOPICAL_OINTMENT | Freq: Two times a day (BID) | CUTANEOUS | 0 refills | Status: AC

## 2024-02-22 NOTE — ED Triage Notes (Addendum)
 Pt presents with knot on front of right leg for 1 day. States its not painful and does not remember hitting leg. No redness noted in leg  Pt also has bug bite on left arm/wrist for 2-3weeks that is very itchy that he would like looked at. Bite is small and normal in color.

## 2024-02-22 NOTE — ED Provider Notes (Signed)
 [facility]    CSN: [id number] Arrival date & time: 02/22/24  1251      History   Chief Complaint Chief Complaint  Patient presents with   Leg Swelling    HPI [name] is a 78 y.o. male.   [name] is a 78 y.o. male presenting for chief complaint of right leg swelling that started yesterday. He woke up with a swollen area to the medial right upper shin. Denies recent injuries to the area of swelling, pain, erythema, rash, or warmth associated with swelling. No pain to the posterior right calf or paresthesias to the right lower extremity. No recent fevers, chills, body aches, or bruising to the area of concern. Denies CP, SOB, palpitations, n/v, right knee pain. He has not attempted treatment of symptoms PTA.   Additionally reports itchy bump to the ventral left wrist that started 3 weeks ago. He believes lesion is a bug bite but is unsure. States area has been persistently itchy. Denies redness, swelling, pus, pain, and drainage from the lesion of concern. He has not injured the left wrist recently and has not attempted treatment of itching to the left wrist PTA.      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   Elevated PSA 03/07/2018   Herpes genitalia 12/22/2015   Hyperlipidemia 12/22/2015   Obesity (BMI 30-39.9) 12/22/2015   Radiculopathy affecting upper extremity 04/26/2015   Hx of repair of left rotator cuff 03/10/2015   Complete tear of left rotator cuff 01/28/2015   Benign essential HTN 12/24/2013   Breathing problem  12/24/2013   Herpes simplex 12/24/2013   Hyperglycemia 12/24/2013   Incomplete emptying of bladder 12/24/2013   Low back pain 12/24/2013   Neck pain 12/24/2013   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 Authorizing Provider  acyclovir  (ZOVIRAX ) 400 MG tablet Take 400 mg by mouth 2 (two) times daily.     [provider]  acyclovir  in dextrose  5 % 250 mL     [provider]  amLODipine  (NORVASC ) 10 MG tablet Take 10 mg by mouth daily.    [provider]  amLODIPine  Besylate 1 MG/ML SOLN     [provider]  aspirin  81 MG chewable tablet Chew 1 tablet (81 mg total) by mouth daily. 01/06/16   Armenta Canning, MD  Aspirin -Salicylamide-Caffeine (BC FAST PAIN RELIEF) 650-195-33.3 MG PACK Take 1 Package by mouth daily  as needed (for headaches).    [provider]  atorvastatin  (LIPITOR) 10 MG tablet     [provider]  atorvastatin  (LIPITOR) 20 MG tablet Take 20 mg by mouth every morning.  05/04/11   [provider]  baclofen  (LIORESAL ) 10 MG tablet Take 1 tablet (10 mg total) by mouth 3 (three) times daily. 09/01/21   Ragan, Michael, FNP  benazepril -hydrochlorthiazide (LOTENSIN  HCT) 10-12.5 MG tablet     [provider]  benazepril -hydrochlorthiazide (LOTENSIN  HCT) 20-25 MG tablet Take 1 tablet by mouth daily.    [provider]  bupivacaine  (MARCAINE ) 0.25 % injection Inject into the  articular space. 09/21/18   [provider]  cetirizine (ZYRTEC) 10 MG tablet Take by mouth.    [provider]  Cetirizine HCl (ZYRTEC ALLERGY) 10 MG CAPS     [provider]  Cholecalciferol (VITAMIN D3) 1.25 MG (50000 UT) CAPS     [provider]  Cholecalciferol 125 MCG (5000 UT) TABS Take by mouth.    [provider]  ciclopirox  (PENLAC ) 8 % solution Apply topically at bedtime. Apply over nail and surrounding skin. Apply daily over previous coat. After seven (7) days, may remove with alcohol and continue cycle. 03/24/22   Gershon Donnice SAUNDERS, DPM  ciclopirox  (PENLAC ) 8 % solution Apply topically at bedtime. Apply over nail and surrounding skin. Apply daily over previous coat. After seven (7) days, may remove with alcohol and continue cycle. 02/21/23   Gershon Donnice SAUNDERS, DPM  dexamethasone (DECADRON) 4 MG/ML injection Inject into the articular space. 09/21/18   [provider]  diazepam (VALIUM) 5 MG tablet SMARTSIG:1 Tablet(s) By Mouth 12/11/20   [provider]  doxycycline  (VIBRA -TABS) 100 MG tablet Take 1 tablet (100 mg total) by mouth 2 (two) times daily. 02/05/24   Gershon Donnice SAUNDERS, DPM  famotidine  (PEPCID ) 20 MG tablet Take 1 tablet (20 mg total) by mouth 2 (two) times daily. 01/06/16   Armenta Canning, MD  fexofenadine  (ALLEGRA ) 180 MG tablet Take 1 tablet (180 mg total) by mouth daily. 01/30/24   Reddick, Johnathan B, NP  gabapentin (NEURONTIN) 300 MG capsule Take 1 capsule by mouth at bedtime. 03/20/18   [provider]  meloxicam (MOBIC) 15 MG tablet Take 1 tablet by mouth daily. 10/21/20   [provider]  meloxicam (MOBIC) 15 MG tablet Take by mouth. 05/06/20   [provider]  methylPREDNISolone  (MEDROL  DOSEPAK) 4 MG TBPK tablet Take as directed. 09/01/21   Ragan, Michael, FNP  potassium chloride  SA (KLOR-CON ) 20 MEQ tablet Take 1 tablet by mouth daily. 10/28/20   [provider]  potassium chloride   SA (KLOR-CON ) 20 MEQ tablet Take 20 mEq by mouth daily. 10/28/20   [provider]  sildenafil (VIAGRA) 100 MG tablet Take 50-100 mg by mouth daily as needed. 12/22/15   [provider]  Sildenafil Citrate 10 MG/12.5ML SOLN     [provider]  tamsulosin (FLOMAX) 0.4 MG CAPS capsule     [provider]  Tamsulosin HCl (FLOMAX) 0.4 MG CAPS Take 0.4 mg by mouth daily after supper.    [provider]  triamcinolone  acetonide (KENALOG -40) 40 MG/ML injection (RADIOLOGY ONLY) Inject into the articular space. 11/10/20   [provider]  triamcinolone  ointment (KENALOG ) 0.1 % Apply 1 Application topically 2 (two) times daily. 02/22/24   Enedelia Dorna HERO, FNP    Family History Family History  Problem Relation Age of Onset   Stroke Mother  Hypertension Father    Hypertension Sister    Hypertension Brother     Social History Social History   Tobacco Use   Smoking status: Former    Current packs/day: 0.00    Average packs/day: 0.5 packs/day for 5.0 years (2.5 ttl pk-yrs)    Types: Cigarettes    Start date: 05/11/1967    Quit date: 05/10/1972    Years since quitting: 51.8   Smokeless tobacco: Never  Vaping Use   Vaping status: Never Used  Substance Use Topics   Alcohol use: No   Drug use: No     Allergies   Penicillins   Review of Systems Review of Systems Per HPI  Physical Exam Triage Vital Signs ED Triage Vitals [02/22/24 1259]  Encounter Vitals Group     BP 135/73     Girls Systolic BP Percentile      Girls Diastolic BP Percentile      Boys Systolic BP Percentile      Boys Diastolic BP Percentile      Pulse Rate 63     Resp 16     Temp 98.2 F (36.8 C)     Temp Source Oral     SpO2 96 %     Weight      Height      Head Circumference      Peak Flow      Pain Score      Pain Loc      Pain Education      Exclude from Growth Chart    No data found.  Updated Vital Signs BP 135/73 (BP Location: Right Arm)    Pulse 63   Temp 98.2 F (36.8 C) (Oral)   Resp 16   SpO2 96%   Visual Acuity Right Eye Distance:   Left Eye Distance:   Bilateral Distance:    Right Eye Near:   Left Eye Near:    Bilateral Near:     Physical Exam Vitals and nursing note reviewed.  Constitutional:      Appearance: He is not ill-appearing or toxic-appearing.  HENT:     Head: Normocephalic and atraumatic.     Right Ear: Hearing and external ear normal.     Left Ear: Hearing and external ear normal.     Nose: Nose normal.     Mouth/Throat:     Lips: Pink.  Eyes:     General: Lids are normal. Vision grossly intact. Gaze aligned appropriately.     Extraocular Movements: Extraocular movements intact.     Conjunctiva/sclera: Conjunctivae normal.  Pulmonary:     Effort: Pulmonary effort is normal.  Musculoskeletal:     Cervical back: Neck supple.       Legs:     Comments: Negative Homans' sign bilaterally.  +2 bilateral anterior tibialis pulses. Ambulatory with steady gait without limp.   Skin:    General: Skin is warm and dry.     Capillary Refill: Capillary refill takes less than 2 seconds.     Findings: No rash.         Comments: +2 left radial pulse  Neurological:     General: No focal deficit present.     Mental Status: He is alert and oriented to person, place, and time. Mental status is at baseline.     Cranial Nerves: No dysarthria or facial asymmetry.  Psychiatric:        Mood and Affect: Mood normal.  Speech: Speech normal.        Behavior: Behavior normal.        Thought Content: Thought content normal.        Judgment: Judgment normal.    Left wrist lesion   UC Treatments / Results  Labs (all labs ordered are listed, but only abnormal results are displayed) Labs Reviewed - No data to display  EKG   Radiology No results found.  Procedures Procedures (including critical care time)  Medications Ordered in UC Medications - No data to display  Initial Impression /  Assessment and Plan / UC Course  I have reviewed the triage vital signs and the nursing notes.  Pertinent labs & imaging results that were available during my care of the patient were reviewed by me and considered in my medical decision making (see chart for details).   1. Skin lesion of left wrist Skin lesion of left wrist consistent with bug bite, no signs of infection. Triamcinolone  PRN every 12 hours for itching, advised to avoid use for longer than 7 days due to risks of skin thinning.   2. Localized swelling of right lower leg Unclear what is causing localized swelling of the right shin. This could be a developing cellulitis/abscess, however I would expect there to be more erythema with infectious causes of swelling.  No signs of injury/ecchymosis.  Low suspicion for DVT, [place]' sign negative bilaterally. Neurovascularly intact to distal BLE.  Advised to monitor site for new/worsening symptoms including erythema, pain, drainage, etc and return if symptoms persist or worsen.   Counseled patient on potential for adverse effects with medications prescribed/recommended today, strict ER and return-to-clinic precautions discussed, patient verbalized understanding.    Final Clinical Impressions(s) / UC Diagnoses   Final diagnoses:  Localized swelling of right lower leg  Skin lesion of wrist   Discharge Instructions   None    ED Prescriptions     Medication Sig Dispense Auth. Provider   triamcinolone  ointment (KENALOG ) 0.1 % Apply 1 Application topically 2 (two) times daily. 15 g Enedelia Dorna HERO, FNP      PDMP not reviewed this encounter.   Enedelia Dorna HERO, [place] 02/22/24 1341

## 2024-03-13 ENCOUNTER — Other Ambulatory Visit: Payer: Self-pay

## 2024-03-13 ENCOUNTER — Ambulatory Visit: Admission: EM | Admit: 2024-03-13 | Discharge: 2024-03-13 | Disposition: A | Discharge status: Home / Self Care

## 2024-03-13 DIAGNOSIS — S29011D Strain of muscle and tendon of front wall of thorax, subsequent encounter: Secondary | ICD-10-CM | POA: Diagnosis not present

## 2024-03-13 NOTE — ED Triage Notes (Addendum)
 Pt presents with complaints of mid-chest pain and right shoulder pain x 4 days. States he was recently seen on Sunday 8/24 at the ED after he tripped and fell outside. Fell directly on his right side. Prescribed hydrocodone -acetaminophen , taking three times a day with no pain relief. Pt believes it is muscular pain from the fall. Describes as stabbing pains that come and go, mainly with movement of right arm. Currently denies pain. Full ROM in right arm and leg.

## 2024-03-13 NOTE — Discharge Instructions (Addendum)
 Based on your symptoms and physical exam I believe the following is the cause of your concern today Pectoral pain from a strain along your right shoulder and right chest muscles  I recommend the following at this time to help relieve that discomfort:  Rest Warm compresses to the area (20 minutes on, minimum of 30 minutes off) You can alternate Tylenol  and Ibuprofen  for pain management but Ibuprofen  is typically preferred to reduce inflammation.  Lidocaine  or Voltaren gel/ cream applied to the area. Gentle stretches and exercises that I have included in your paperwork Try to reduce excess strain to the area and rest as much as possible  Wear supportive shoes and, if you must lift anything, use proper lifting techniques that spare your back.   If your symptoms are not improving or seem to be worsening over the next 1-2 weeks please follow up with your PCP for ongoing management

## 2024-03-13 NOTE — ED Provider Notes (Signed)
 [facility]    CSN: [id number] Arrival date & time: 03/13/24  1101      History   Chief Complaint Chief Complaint  Patient presents with   Shoulder Pain   Chest Pain    HPI [name] is a 78 y.o. male. has a past medical history of Arthritis, Genital herpes, Headache(784.0), Hyperlipidemia, and Hypertension.   HPI  Pt reports that he fell on Sunday while he was walking backwards and tripped over a stump He states he fell onto the right side but did not hit his head  He states he was seen in the ED for this and xrays were negative He reports in the morning when he wakes up he has a shooting pain from the right shoulder across the right pectoral and into the sternal area He reports pain with turning to the left - states it pulls on the right side  He reports bending forward also causes a pulling sensation in the right side of the chest Pain level and character: 10/10 when pain shoots through from shoulder. He reports current pain is 2-3/10 unless he performs aggravating movements   He denies known contraindications to NSAIDs     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   Elevated PSA 03/07/2018   Herpes genitalia 12/22/2015   Hyperlipidemia 12/22/2015   Obesity (BMI 30-39.9) 12/22/2015   Radiculopathy affecting upper extremity 04/26/2015   Hx of repair of left rotator cuff 03/10/2015   Complete tear of left rotator cuff 01/28/2015   Benign essential HTN 12/24/2013   Breathing problem 12/24/2013   Herpes simplex 12/24/2013    Hyperglycemia 12/24/2013   Incomplete emptying of bladder 12/24/2013   Low back pain 12/24/2013   Neck pain 12/24/2013   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 Authorizing Provider  acyclovir  (ZOVIRAX ) 400 MG tablet Take 400 mg by mouth 2 (two) times daily.     [provider]  acyclovir  in dextrose  5 % 250 mL     [provider]  amLODipine  (NORVASC ) 10 MG tablet Take 10 mg by mouth daily.    [provider]  amLODIPine  Besylate 1 MG/ML SOLN     [provider]  aspirin  81 MG chewable tablet Chew 1 tablet (81 mg total) by mouth daily. 01/06/16   Armenta Canning, MD  Aspirin -Salicylamide-Caffeine (BC FAST PAIN RELIEF) 650-195-33.3 MG PACK Take 1 Package by mouth daily as needed (  for headaches).    [provider]  atorvastatin  (LIPITOR) 10 MG tablet     [provider]  atorvastatin  (LIPITOR) 20 MG tablet Take 20 mg by mouth every morning.  05/04/11   [provider]  baclofen  (LIORESAL ) 10 MG tablet Take 1 tablet (10 mg total) by mouth 3 (three) times daily. 09/01/21   Ragan, Michael, FNP  benazepril -hydrochlorthiazide (LOTENSIN  HCT) 10-12.5 MG tablet     [provider]  benazepril -hydrochlorthiazide (LOTENSIN  HCT) 20-25 MG tablet Take 1 tablet by mouth daily.    [provider]  bupivacaine  (MARCAINE ) 0.25 % injection Inject into the articular space. 09/21/18   [provider]  cetirizine (ZYRTEC) 10 MG tablet Take by mouth.    [provider]  Cetirizine HCl (ZYRTEC ALLERGY) 10 MG CAPS     [provider]  Cholecalciferol (VITAMIN D3) 1.25 MG (50000 UT) CAPS     [provider]  Cholecalciferol 125 MCG (5000 UT) TABS Take by mouth.    [provider]  ciclopirox  (PENLAC ) 8 % solution Apply topically at bedtime. Apply over nail and surrounding skin. Apply daily over previous coat. After seven (7) days, may remove with alcohol and continue cycle. 03/24/22   Gershon Donnice SAUNDERS, DPM  ciclopirox  (PENLAC ) 8 % solution Apply topically at bedtime. Apply over nail and surrounding skin. Apply daily over previous coat. After seven (7) days, may remove with alcohol and continue cycle. 02/21/23   Gershon Donnice SAUNDERS, DPM  dexamethasone (DECADRON) 4 MG/ML injection Inject into the articular space. 09/21/18   [provider]  diazepam (VALIUM) 5 MG tablet SMARTSIG:1 Tablet(s) By Mouth 12/11/20   [provider]  doxycycline  (VIBRA -TABS) 100 MG tablet Take 1 tablet (100 mg total) by mouth 2 (two) times daily. 02/05/24   Gershon Donnice SAUNDERS, DPM  famotidine  (PEPCID ) 20 MG tablet Take 1 tablet (20 mg total) by mouth 2 (two) times daily. 01/06/16   Armenta Canning, MD  fexofenadine  (ALLEGRA ) 180 MG tablet Take 1 tablet (180 mg total) by mouth daily. 01/30/24   Reddick, Johnathan B, NP  gabapentin (NEURONTIN) 300 MG capsule Take 1 capsule by mouth at bedtime. 03/20/18   [provider]  meloxicam (MOBIC) 15 MG tablet Take 1 tablet by mouth daily. 10/21/20   [provider]  meloxicam (MOBIC) 15 MG tablet Take by mouth. 05/06/20   [provider]  methylPREDNISolone  (MEDROL  DOSEPAK) 4 MG TBPK tablet Take as directed. 09/01/21   Ragan, Michael, FNP  potassium chloride  SA (KLOR-CON ) 20 MEQ tablet Take 1 tablet by mouth daily. 10/28/20   [provider]  potassium chloride  SA (KLOR-CON ) 20 MEQ tablet Take 20  mEq by mouth daily. 10/28/20   [provider]  sildenafil (VIAGRA) 100 MG tablet Take 50-100 mg by mouth daily as needed. 12/22/15   [provider]  Sildenafil Citrate 10 MG/12.5ML SOLN     [provider]  tamsulosin (FLOMAX) 0.4 MG CAPS capsule     [provider]  Tamsulosin HCl (FLOMAX) 0.4 MG CAPS Take 0.4 mg by mouth daily after supper.    [provider]  triamcinolone  acetonide (KENALOG -40) 40 MG/ML injection (RADIOLOGY ONLY) Inject into the articular space. 11/10/20   [provider]  triamcinolone  ointment (KENALOG ) 0.1 % Apply 1 Application topically 2 (two) times daily. 02/22/24   Enedelia Dorna HERO, FNP    Family History Family History  Problem Relation Age of Onset   Stroke Mother    Hypertension  Father    Hypertension Sister    Hypertension Brother     Social History Social History   Tobacco Use   Smoking status: Former    Current packs/day: 0.00    Average packs/day: 0.5 packs/day for 5.0 years (2.5 ttl pk-yrs)    Types: Cigarettes    Start date: 05/11/1967    Quit date: 05/10/1972    Years since quitting: 51.8   Smokeless tobacco: Never  Vaping Use   Vaping status: Never Used  Substance Use Topics   Alcohol use: No   Drug use: No     Allergies   Penicillins   Review of Systems Review of Systems  Respiratory:  Negative for chest tightness, shortness of breath and wheezing.   Cardiovascular:  Negative for palpitations.  Musculoskeletal:        Right sided chest pain - along pectoral area      Physical Exam Triage Vital Signs ED Triage Vitals  Encounter Vitals Group     BP 03/13/24 1120 134/73     Girls Systolic BP Percentile --      Girls Diastolic BP Percentile --      Boys Systolic BP Percentile --      Boys Diastolic BP Percentile --      Pulse Rate 03/13/24 1120 65     Resp 03/13/24 1120 20     Temp 03/13/24 1120 97.7 F (36.5 C)     Temp Source 03/13/24 1120 Oral     SpO2 03/13/24  1120 95 %     Weight 03/13/24 1120 212 lb (96.2 kg)     Height 03/13/24 1120 5' 8 (1.727 m)     Head Circumference --      Peak Flow --      Pain Score 03/13/24 1123 0     Pain Loc --      Pain Education --      Exclude from Growth Chart --    No data found.  Updated Vital Signs BP 134/73 (BP Location: Left Arm)   Pulse 65   Temp 97.7 F (36.5 C) (Oral)   Resp 20   Ht 5' 8 (1.727 m)   Wt 212 lb (96.2 kg)   SpO2 95%   BMI 32.23 kg/m   Visual Acuity Right Eye Distance:   Left Eye Distance:   Bilateral Distance:    Right Eye Near:   Left Eye Near:    Bilateral Near:     Physical Exam Vitals reviewed.  Constitutional:      General: He is awake.     Appearance: Normal appearance. He is well-developed and well-groomed.  HENT:     Head: Normocephalic and atraumatic.  Eyes:     Extraocular Movements: Extraocular movements intact.     Conjunctiva/sclera: Conjunctivae normal.  Pulmonary:     Effort: Pulmonary effort is normal.  Chest:       Comments: Pt denies pain with palpation of the right pectoral area.  Musculoskeletal:     Right shoulder: No swelling, deformity or tenderness. Normal range of motion. Normal strength. Normal pulse.     Cervical back: Normal range of motion.  Neurological:     Mental Status: He is alert and oriented to person, place, and time.  Psychiatric:        Attention and Perception: Attention normal.        Mood and Affect: Mood normal.        Speech: Speech normal.  Behavior: Behavior normal. Behavior is cooperative.      UC Treatments / Results  Labs (all labs ordered are listed, but only abnormal results are displayed) Labs Reviewed - No data to display  EKG   Radiology No results found.  Procedures Procedures (including critical care time)  Medications Ordered in UC Medications - No data to display  Initial Impression / Assessment and Plan / UC Course  I have reviewed the triage vital signs and the nursing  notes.  Pertinent labs & imaging results that were available during my care of the patient were reviewed by me and considered in my medical decision making (see chart for details).      Patient was seen previously on 03/10/2024 at the emergency room following the fall incident. Review of the encounter visit notes shows the following: He had imaging performed of the chest, right ribs, right elbow, pelvis and right hip which were all negative for signs of acute fracture or dislocation.  Provider at the ED recommended supportive care and provided prescription for hydrocodone -acetaminophen  5-325 mg p.o. every 4 hours as needed for up to 3 days with a quantity of 12 tablets. Review of office visit from 11/14/23 demonstrates eGFR of 79 from labs completed that day   Final Clinical Impressions(s) / UC Diagnoses   Final diagnoses:  Strain of right pectoralis muscle, subsequent encounter   Patient presents today with concerns for right sided chest pain that radiates from the right shoulder following a fall that occurred on 03/10/2024.  Patient was evaluated in the emergency room for similar concern and x-rays were negative for signs of fracture or acute dislocation.  He reports that he has been taking hydrocodone -acetaminophen  5-325 mg 3 times per day to assist with pain but this is providing minimal improvement.  If physical exam does not reveal evidence of reduced range of motion of the right shoulder and right pectoral pain is not reproducible with palpation but he reports that if he leans forward for 5 to 10 minutes and then returns to an upright position this will cause severe pain in the right pectoral area.  At this time suspect muscular etiology from the trauma of his fall and recommend alternating NSAIDs with Tylenol  and using hydrocodone -acetaminophen  as needed for breakthrough pain.  Reviewed topical applications that he can use such as Voltaren and lidocaine .  Recommend gentle stretches and massage as  tolerated for further assistance with pain control. return precautions reviewed and provided after visit summary.  Follow-up as needed.    Discharge Instructions      Based on your symptoms and physical exam I believe the following is the cause of your concern today Pectoral pain from a strain along your right shoulder and right chest muscles  I recommend the following at this time to help relieve that discomfort:  Rest Warm compresses to the area (20 minutes on, minimum of 30 minutes off) You can alternate Tylenol  and Ibuprofen  for pain management but Ibuprofen  is typically preferred to reduce inflammation.  Lidocaine  or Voltaren gel/ cream applied to the area. Gentle stretches and exercises that I have included in your paperwork Try to reduce excess strain to the area and rest as much as possible  Wear supportive shoes and, if you must lift anything, use proper lifting techniques that spare your back.   If your symptoms are not improving or seem to be worsening over the next 1-2 weeks please follow up with your PCP for ongoing management  ED Prescriptions   None    PDMP not reviewed this encounter.   Marylene Rocky BRAVO, PA-C 03/13/24 1205

## 2024-03-13 NOTE — ED Notes (Addendum)
 This RN made Rocky Mecum PA aware of pt's presenting symptoms. PA states to hold off on EKG, sounds muscular related.

## 2024-04-16 ENCOUNTER — Ambulatory Visit
Admission: EM | Admit: 2024-04-16 | Discharge: 2024-04-16 | Disposition: A | Discharge status: Home / Self Care | Attending: Physician Assistant | Admitting: Physician Assistant

## 2024-04-16 ENCOUNTER — Other Ambulatory Visit: Payer: Self-pay

## 2024-04-16 DIAGNOSIS — R0789 Other chest pain: Secondary | ICD-10-CM

## 2024-04-16 MED ORDER — IBUPROFEN 800 MG PO TABS
800.0000 mg | ORAL_TABLET | Freq: Once | ORAL | Status: AC
  Administered 2024-04-16: 800 mg via ORAL

## 2024-04-16 NOTE — ED Provider Notes (Addendum)
 [facility]    CSN: [id number] Arrival date & time: 04/16/24  1727      History   Chief Complaint Chief Complaint  Patient presents with   Chest Pain    HPI [name] is a 78 y.o. male.  has a past medical history of Arthritis, Genital herpes, Headache(784.0), Hyperlipidemia, and Hypertension.   HPI Discussed the use of AI scribe software for clinical note transcription with the patient, who gave verbal consent to proceed.  The patient is a 78 year old  who presents with left-sided chest pain.  They have been experiencing sharp left-sided chest pain since Sunday night, primarily triggered by movement such as leaning forward, coughing, or sneezing, and also elicited by applying pressure to the area. This pain is more severe than previous episodes.  In August, they experienced a fall, landing on their right hip and shoulder, resulting in pain on the right side, which was attributed to muscle strain. They have had episodes of chest pain in the past, but this current pain is new and worse.  They have a history of cardiac evaluation, including an echocardiogram and CT scan, which revealed aortic valve regurgitation and a blocked artery. They wore a heart monitor for a week in August, which showed irregularities. They are scheduled for further cardiac evaluation in November.  They have visited the emergency room twice for chest pain, where chest X-rays showed no acute findings. They have not been taking any specific medication for the current pain but are on aspirin  81 mg daily.  They are concerned about the pain's impact on their ability to work, noting they were out of work for a week following the fall and have since returned to work. They are active and engage in physical activities such as going to the gym.  They work outdoors, often in the sun, which has previously led to feelings of chest pressure. They have not been on blood thinners and have not been advised  against taking ibuprofen .   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   Elevated PSA 03/07/2018   Herpes genitalia 12/22/2015   Hyperlipidemia 12/22/2015   Obesity (BMI 30-39.9) 12/22/2015   Radiculopathy affecting upper extremity 04/26/2015   Hx of repair of left rotator cuff 03/10/2015   Complete tear of left rotator cuff 01/28/2015   Benign essential HTN 12/24/2013   Breathing problem 12/24/2013   Herpes simplex 12/24/2013   Hyperglycemia 12/24/2013   Incomplete emptying of bladder 12/24/2013   Low back pain 12/24/2013   Neck pain 12/24/2013   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 Authorizing Provider  acyclovir  (ZOVIRAX ) 400 MG tablet Take 400 mg by mouth 2 (two) times daily.     [provider]  acyclovir  in dextrose  5 % 250 mL     [provider]  amLODipine  (NORVASC ) 10 MG tablet Take 10 mg by  mouth daily.    [provider]  amLODIPine  Besylate 1 MG/ML SOLN     [provider]  aspirin  81 MG chewable tablet Chew 1 tablet (81 mg total) by mouth daily. 01/06/16   Armenta Canning, MD  Aspirin -Salicylamide-Caffeine (BC FAST PAIN RELIEF) 650-195-33.3 MG PACK Take 1 Package by mouth daily as needed (for headaches).    [provider]  atorvastatin  (LIPITOR) 10 MG tablet     [provider]  atorvastatin  (LIPITOR) 20 MG tablet Take 20 mg by mouth every morning.  05/04/11   [provider]  baclofen  (LIORESAL ) 10 MG tablet Take 1 tablet (10 mg total) by mouth 3 (three) times daily. 09/01/21   Ragan, Michael, FNP  benazepril -hydrochlorthiazide (LOTENSIN  HCT) 10-12.5 MG tablet     [provider]  benazepril -hydrochlorthiazide (LOTENSIN  HCT) 20-25 MG tablet Take 1 tablet by mouth daily.    [provider]  bupivacaine  (MARCAINE ) 0.25 % injection Inject into the articular space. 09/21/18   [provider]  cetirizine (ZYRTEC) 10 MG tablet Take by mouth.    [provider]  Cetirizine HCl (ZYRTEC ALLERGY) 10 MG CAPS     [provider]  Cholecalciferol (VITAMIN D3) 1.25 MG (50000 UT) CAPS     [provider]  Cholecalciferol 125 MCG (5000 UT) TABS Take by mouth.    [provider]  ciclopirox  (PENLAC ) 8 % solution Apply topically at bedtime. Apply over nail and surrounding skin. Apply daily over previous coat. After seven (7) days, may remove with alcohol and continue cycle. 03/24/22   Gershon Donnice SAUNDERS, DPM  ciclopirox  (PENLAC ) 8 % solution Apply topically at bedtime. Apply over nail and surrounding skin. Apply daily over previous coat. After seven (7) days, may remove with alcohol and continue cycle. 02/21/23   Gershon Donnice SAUNDERS, DPM  dexamethasone (DECADRON) 4 MG/ML injection Inject into the articular space. 09/21/18   [provider]  diazepam (VALIUM) 5 MG tablet SMARTSIG:1 Tablet(s) By  Mouth 12/11/20   [provider]  doxycycline  (VIBRA -TABS) 100 MG tablet Take 1 tablet (100 mg total) by mouth 2 (two) times daily. 02/05/24   Gershon Donnice SAUNDERS, DPM  famotidine  (PEPCID ) 20 MG tablet Take 1 tablet (20 mg total) by mouth 2 (two) times daily. 01/06/16   Armenta Canning, MD  fexofenadine  (ALLEGRA ) 180 MG tablet Take 1 tablet (180 mg total) by mouth daily. 01/30/24   Reddick, Johnathan B, NP  gabapentin (NEURONTIN) 300 MG capsule Take 1 capsule by mouth at bedtime. 03/20/18   [provider]  meloxicam (MOBIC) 15 MG tablet Take 1 tablet by mouth daily. 10/21/20   [provider]  meloxicam (MOBIC) 15 MG tablet Take by mouth. 05/06/20   [provider]  methylPREDNISolone  (MEDROL  DOSEPAK) 4 MG TBPK tablet Take as directed. 09/01/21   Ragan, Michael, FNP  potassium chloride  SA (KLOR-CON ) 20 MEQ tablet Take 1 tablet by mouth daily.  10/28/20   [provider]  potassium chloride  SA (KLOR-CON ) 20 MEQ tablet Take 20 mEq by mouth daily. 10/28/20   [provider]  sildenafil (VIAGRA) 100 MG tablet Take 50-100 mg by mouth daily as needed. 12/22/15   [provider]  Sildenafil Citrate 10 MG/12.5ML SOLN     [provider]  tamsulosin (FLOMAX) 0.4 MG CAPS capsule     [provider]  Tamsulosin HCl (FLOMAX) 0.4 MG CAPS Take 0.4 mg by mouth daily after supper.    [provider]  triamcinolone  acetonide (KENALOG -40) 40 MG/ML injection (RADIOLOGY ONLY) Inject into the articular space. 11/10/20   [provider]  triamcinolone  ointment (KENALOG ) 0.1 % Apply 1 Application topically 2 (two) times daily. 02/22/24   Enedelia Dorna HERO, FNP    Family History Family History  Problem Relation Age of Onset   Stroke Mother    Hypertension Father    Hypertension Sister    Hypertension Brother     Social History Social History   Tobacco Use   Smoking status: Former    Current packs/day: 0.00    Average  packs/day: 0.5 packs/day for 5.0 years (2.5 ttl pk-yrs)    Types: Cigarettes    Start date: 05/11/1967    Quit date: 05/10/1972    Years since quitting: 51.9   Smokeless tobacco: Never  Vaping Use   Vaping status: Never Used  Substance Use Topics   Alcohol use: No   Drug use: No     Allergies   Penicillins   Review of Systems Review of Systems  Cardiovascular:  Positive for chest pain.     Physical Exam Triage Vital Signs ED Triage Vitals  Encounter Vitals Group     BP 04/16/24 1743 135/68     Girls Systolic BP Percentile --      Girls Diastolic BP Percentile --      Boys Systolic BP Percentile --      Boys Diastolic BP Percentile --      Pulse Rate 04/16/24 1743 68     Resp 04/16/24 1743 18     Temp 04/16/24 1743 98 F (36.7 C)     Temp Source 04/16/24 1743 Oral     SpO2 04/16/24 1743 94 %     Weight 04/16/24 1745 212 lb 1.3 oz (96.2 kg)     Height 04/16/24 1745 5' 8 (1.727 m)     Head Circumference --      Peak Flow --      Pain Score 04/16/24 1744 4     Pain Loc --      Pain Education --      Exclude from Growth Chart --    No data found.  Updated Vital Signs BP 135/68 (BP Location: Right Arm)   Pulse 68   Temp 98 F (36.7 C) (Oral)   Resp 18   Ht 5' 8 (1.727 m)   Wt 212 lb 1.3 oz (96.2 kg)   SpO2 94%   BMI 32.25 kg/m   Visual Acuity Right Eye Distance:   Left Eye Distance:   Bilateral Distance:    Right Eye Near:   Left Eye Near:    Bilateral Near:     Physical Exam Vitals reviewed.  Constitutional:      General: He is awake. He is not in acute distress.    Appearance: Normal appearance. He is well-developed and well-groomed. He is not ill-appearing, toxic-appearing or diaphoretic.  HENT:  Head: Normocephalic and atraumatic.  Cardiovascular:     Rate and Rhythm: Normal rate and regular rhythm.     Heart sounds: Murmur (grade 2 murmur appreciated) heard.     No friction rub. No gallop.  Pulmonary:     Effort: Pulmonary effort  is normal.     Breath sounds: Normal breath sounds. No decreased air movement. No decreased breath sounds, wheezing, rhonchi or rales.  Chest:     Chest wall: Tenderness present.     Comments: Patient reports increased left-sided chest discomfort with abduction of the left arm.  He reports that this reproduces his chest pain and causes a sharp discomfort along the left side. Musculoskeletal:     Cervical back: Normal range of motion and neck supple.  Skin:    General: Skin is warm and dry.  Neurological:     General: No focal deficit present.     Mental Status: He is alert and oriented to person, place, and time.  Psychiatric:        Mood and Affect: Mood normal.        Behavior: Behavior normal. Behavior is cooperative.        Thought Content: Thought content normal.        Judgment: Judgment normal.      UC Treatments / Results  Labs (all labs ordered are listed, but only abnormal results are displayed) Labs Reviewed - No data to display  EKG   Radiology No results found.  Procedures ED EKG  Date/Time: 04/16/2024 6:30 PM  Performed by: Marylene Rocky BRAVO, PA-C Authorized by: Marylene Rocky BRAVO, PA-C   Previous ECG:    Previous ECG:  Compared to current   Comparison ECG info:  Previous EKG demonstrated infrequent PVC - absent today Interpretation:    Interpretation: normal     Details:  Demonstrates normal sinus rhythm, rate of 65, evidence of RBBB, no ST elevations or depressions Rate:    ECG rate:  65   ECG rate assessment: normal   Rhythm:    Rhythm: sinus rhythm   Ectopy:    Ectopy: none   QRS:    QRS intervals:  Normal   QRS conduction: RBBB   ST segments:    ST segments:  Normal T waves:    T waves: normal    (including critical care time)  Medications Ordered in UC Medications  ibuprofen  (ADVIL ) tablet 800 mg (800 mg Oral Given 04/16/24 1836)    Initial Impression / Assessment and Plan / UC Course  I have reviewed the triage vital signs and the nursing  notes.  Pertinent labs & imaging results that were available during my care of the patient were reviewed by me and considered in my medical decision making (see chart for details).    Previous EKG from 01/06/2016 used for comparison. Unable to see more recent EKG from recent ED visit on 04/06/24 which demonstrated : Diagnosis Sinus rhythm with 1st degree AV block Increased R/S ratio in V1, consider early transition or posterior infarct   Final Clinical Impressions(s) / UC Diagnoses   Final diagnoses:  Other chest pain  Musculoskeletal chest pain  Left-sided chest wall pain (musculoskeletal) Acute left-sided chest wall pain, likely musculoskeletal, exacerbated by movement, deep breaths, and coughing. Pain is sharp and occurs with specific movements, suggesting involvement of the pectoral muscles. No evidence of cardiac origin based on normal EKG. reviewed with patient that if he is still concerned for potential cardiac injury he should go to  the emergency room for blood work and troponins. - Recommend alternating ibuprofen  and acetaminophen  for pain management. - Advise warm compresses and stretching exercises to prevent stiffness. - Provide ibuprofen  in the clinic for immediate pain relief.  Review of echocardiogram earlier this year demonstrates trace aortic valve regurgitation.  I am suspicious that this may be the source of the cardiac murmur heard on exam.  Patient does have further evaluation and cardiac workup scheduled with cardiology later in October.  Recommend that he keeps this appointment for ongoing evaluation. ED and return precautions reviewed and provided in AVS.  Follow-up as needed for progressing or persistent symptoms    Discharge Instructions      VISIT SUMMARY:  You came in today because of sharp left-sided chest pain that started on Sunday night. This pain is worse than previous episodes and is triggered by movements like leaning forward, coughing, or sneezing.  You have a history of coronary artery disease and aortic valve regurgitation, and you are scheduled for further cardiac evaluation in November. You have also experienced a fall in August, which caused pain on your right side.  YOUR PLAN:  -LEFT-SIDED CHEST WALL PAIN (MUSCULOSKELETAL): Your chest pain is likely due to muscle strain, as it is sharp and worsens with movement, deep breaths, and coughing. There is no evidence that it is related to your heart. You should alternate between taking ibuprofen  and acetaminophen  for pain relief, use warm compresses, and do stretching exercises to prevent stiffness. We provided you with ibuprofen  in the clinic for immediate relief.  -AORTIC VALVE REGURGITATION WITH HEART MURMUR: Aortic valve regurgitation means that your aortic valve is not closing properly, causing blood to flow backward into the heart. This condition was identified in your previous echocardiogram. You should follow up with your cardiologist  for further evaluation and management.   INSTRUCTIONS:  Please follow up with your cardiologist  for further evaluation of your aortic valve regurgitation. Continue taking your current cardiac medications as prescribed. Alternate between ibuprofen  and acetaminophen  for your chest pain, and use warm compresses and stretching exercises to prevent stiffness. If you start to have worsening chest pain, trouble breathing, severe headache, confusion, loss of consciousness please go to the emergency room or call 911 for assistance.     ED Prescriptions   None    PDMP not reviewed this encounter.   Marylene Rocky BRAVO, PA-C 04/16/24 2141    Alyza Artiaga E, PA-C 04/16/24 2155

## 2024-04-16 NOTE — ED Notes (Signed)
 EKG handed off to [facility].

## 2024-04-16 NOTE — ED Triage Notes (Signed)
 Pt presents with a chief complaint of left-sided chest pain x 3 days. Currently rates overall pain a 4/10. Pain increases with bending down and coughing (sudden movements). OTC pain relief gel + heating pad applied with no improvement/pain relief. Denies SOB. Came in today due to the pain becoming worse and constant.

## 2024-04-16 NOTE — Discharge Instructions (Addendum)
 VISIT SUMMARY:  You came in today because of sharp left-sided chest pain that started on Sunday night. This pain is worse than previous episodes and is triggered by movements like leaning forward, coughing, or sneezing. You have a history of coronary artery disease and aortic valve regurgitation, and you are scheduled for further cardiac evaluation in November. You have also experienced a fall in August, which caused pain on your right side.  YOUR PLAN:  -LEFT-SIDED CHEST WALL PAIN (MUSCULOSKELETAL): Your chest pain is likely due to muscle strain, as it is sharp and worsens with movement, deep breaths, and coughing. There is no evidence that it is related to your heart. You should alternate between taking ibuprofen  and acetaminophen  for pain relief, use warm compresses, and do stretching exercises to prevent stiffness. We provided you with ibuprofen  in the clinic for immediate relief.  -AORTIC VALVE REGURGITATION WITH HEART MURMUR: Aortic valve regurgitation means that your aortic valve is not closing properly, causing blood to flow backward into the heart. This condition was identified in your previous echocardiogram. You should follow up with your cardiologist  for further evaluation and management.   INSTRUCTIONS:  Please follow up with your cardiologist  for further evaluation of your aortic valve regurgitation. Continue taking your current cardiac medications as prescribed. Alternate between ibuprofen  and acetaminophen  for your chest pain, and use warm compresses and stretching exercises to prevent stiffness. If you start to have worsening chest pain, trouble breathing, severe headache, confusion, loss of consciousness please go to the emergency room or call 911 for assistance.
